# Patient Record
Sex: Female | Born: 1970 | Race: White | Hispanic: No | Marital: Married | Smoking: Never smoker
Health system: Southern US, Community
[De-identification: ages and names within clinical notes are randomized; demographics above are authoritative.]

## PROBLEM LIST (undated history)

## (undated) DIAGNOSIS — Z8719 Personal history of other diseases of the digestive system: Secondary | ICD-10-CM

## (undated) DIAGNOSIS — J189 Pneumonia, unspecified organism: Secondary | ICD-10-CM

## (undated) DIAGNOSIS — D649 Anemia, unspecified: Secondary | ICD-10-CM

## (undated) DIAGNOSIS — IMO0002 Reserved for concepts with insufficient information to code with codable children: Secondary | ICD-10-CM

## (undated) DIAGNOSIS — Z6741 Type O blood, Rh negative: Secondary | ICD-10-CM

## (undated) DIAGNOSIS — O09529 Supervision of elderly multigravida, unspecified trimester: Secondary | ICD-10-CM

## (undated) DIAGNOSIS — B379 Candidiasis, unspecified: Secondary | ICD-10-CM

## (undated) DIAGNOSIS — Z8619 Personal history of other infectious and parasitic diseases: Secondary | ICD-10-CM

## (undated) DIAGNOSIS — Z87440 Personal history of urinary (tract) infections: Secondary | ICD-10-CM

## (undated) DIAGNOSIS — K219 Gastro-esophageal reflux disease without esophagitis: Secondary | ICD-10-CM

## (undated) HISTORY — DX: Supervision of elderly multigravida, unspecified trimester: O09.529

## (undated) HISTORY — DX: Personal history of other infectious and parasitic diseases: Z86.19

## (undated) HISTORY — DX: Candidiasis, unspecified: B37.9

## (undated) HISTORY — PX: EYE SURGERY: SHX253

## (undated) HISTORY — PX: COLPOSCOPY: SHX161

## (undated) HISTORY — DX: Personal history of urinary (tract) infections: Z87.440

## (undated) HISTORY — DX: Anemia, unspecified: D64.9

## (undated) HISTORY — DX: Personal history of other diseases of the digestive system: Z87.19

---

## 1999-10-18 HISTORY — PX: LASIK: SHX215

## 2008-10-17 DIAGNOSIS — R87619 Unspecified abnormal cytological findings in specimens from cervix uteri: Secondary | ICD-10-CM

## 2008-10-17 DIAGNOSIS — IMO0002 Reserved for concepts with insufficient information to code with codable children: Secondary | ICD-10-CM

## 2008-10-17 HISTORY — DX: Unspecified abnormal cytological findings in specimens from cervix uteri: R87.619

## 2008-10-17 HISTORY — DX: Reserved for concepts with insufficient information to code with codable children: IMO0002

## 2011-01-28 ENCOUNTER — Other Ambulatory Visit (HOSPITAL_COMMUNITY): Payer: Self-pay | Admitting: Obstetrics and Gynecology

## 2011-01-28 DIAGNOSIS — IMO0002 Reserved for concepts with insufficient information to code with codable children: Secondary | ICD-10-CM

## 2011-04-07 ENCOUNTER — Ambulatory Visit (HOSPITAL_COMMUNITY): Payer: Managed Care, Other (non HMO)

## 2011-04-07 ENCOUNTER — Inpatient Hospital Stay (HOSPITAL_COMMUNITY)
Admission: AD | Admit: 2011-04-07 | Discharge: 2011-04-07 | Disposition: A | Discharge status: Home / Self Care | Payer: Managed Care, Other (non HMO) | Source: Ambulatory Visit | Attending: Obstetrics and Gynecology | Admitting: Obstetrics and Gynecology

## 2011-04-07 DIAGNOSIS — Z36 Encounter for antenatal screening of mother: Secondary | ICD-10-CM | POA: Insufficient documentation

## 2011-04-08 LAB — RH IMMUNE GLOBULIN WORKUP (NOT WOMEN'S HOSP)

## 2011-08-12 ENCOUNTER — Inpatient Hospital Stay (HOSPITAL_COMMUNITY)
Admission: AD | Admit: 2011-08-12 | Discharge: 2011-08-12 | Disposition: A | Discharge status: Home / Self Care | Payer: Managed Care, Other (non HMO) | Source: Ambulatory Visit | Attending: Obstetrics and Gynecology | Admitting: Obstetrics and Gynecology

## 2011-08-12 DIAGNOSIS — Z2989 Encounter for other specified prophylactic measures: Secondary | ICD-10-CM | POA: Insufficient documentation

## 2011-08-12 DIAGNOSIS — Z3480 Encounter for supervision of other normal pregnancy, unspecified trimester: Secondary | ICD-10-CM | POA: Insufficient documentation

## 2011-08-12 DIAGNOSIS — Z298 Encounter for other specified prophylactic measures: Secondary | ICD-10-CM | POA: Insufficient documentation

## 2011-08-12 MED ORDER — RHO D IMMUNE GLOBULIN 1500 UNIT/2ML IJ SOLN
300.0000 ug | Freq: Once | INTRAMUSCULAR | Status: AC
  Administered 2011-08-12: 300 ug via INTRAMUSCULAR
  Filled 2011-08-12: qty 2

## 2011-08-14 LAB — RH IG WORKUP (INCLUDES ABO/RH)

## 2011-09-17 ENCOUNTER — Encounter (HOSPITAL_COMMUNITY): Payer: Self-pay

## 2011-09-17 ENCOUNTER — Inpatient Hospital Stay (HOSPITAL_COMMUNITY)
Admission: AD | Admit: 2011-09-17 | Discharge: 2011-09-17 | Disposition: A | Discharge status: Home / Self Care | Payer: Managed Care, Other (non HMO) | Source: Ambulatory Visit | Attending: Obstetrics and Gynecology | Admitting: Obstetrics and Gynecology

## 2011-09-17 DIAGNOSIS — O09519 Supervision of elderly primigravida, unspecified trimester: Secondary | ICD-10-CM

## 2011-09-17 DIAGNOSIS — Z3490 Encounter for supervision of normal pregnancy, unspecified, unspecified trimester: Secondary | ICD-10-CM

## 2011-09-17 DIAGNOSIS — O99891 Other specified diseases and conditions complicating pregnancy: Secondary | ICD-10-CM | POA: Insufficient documentation

## 2011-09-17 HISTORY — DX: Reserved for concepts with insufficient information to code with codable children: IMO0002

## 2011-09-17 HISTORY — DX: Pneumonia, unspecified organism: J18.9

## 2011-09-17 LAB — AMNISURE RUPTURE OF MEMBRANE (ROM) NOT AT ARMC: Amnisure ROM: NEGATIVE

## 2011-09-17 NOTE — Progress Notes (Signed)
Patient states she had a gush of clear fluid at 1030 this morning, but has not had any since that time. Has some mild irregular contractions. Reports good fetal movement.

## 2011-09-17 NOTE — Discharge Instructions (Signed)
Fetal Movement Counts °Patient Name: __________________________________________________ Patient Due Date: ____________________ °Kick counts is highly recommended in high risk pregnancies, but it is a good idea for every pregnant woman to do. Start counting fetal movements at 28 weeks of the pregnancy. Fetal movements increase after eating a full meal or eating or drinking something sweet (the blood sugar is higher). It is also important to drink plenty of fluids (well hydrated) before doing the count. Lie on your left side because it helps with the circulation or you can sit in a comfortable chair with your arms over your belly (abdomen) with no distractions around you. °DOING THE COUNT °· Try to do the count the same time of day each time you do it.  °· Mark the day and time, then see how long it takes for you to feel 10 movements (kicks, flutters, swishes, rolls). You should have at least 10 movements within 2 hours. You will most likely feel 10 movements in much less than 2 hours. If you do not, wait an hour and count again. After a couple of days you will see a pattern.  °· What you are looking for is a change in the pattern or not enough counts in 2 hours. Is it taking longer in time to reach 10 movements?  °SEEK MEDICAL CARE IF: °· You feel less than 10 counts in 2 hours. Tried twice.  °· No movement in one hour.  °· The pattern is changing or taking longer each day to reach 10 counts in 2 hours.  °· You feel the baby is not moving as it usually does.  °Date: ____________ Movements: ____________ Start time: ____________ Finish time: ____________  °Date: ____________ Movements: ____________ Start time: ____________ Finish time: ____________ °Date: ____________ Movements: ____________ Start time: ____________ Finish time: ____________ °Date: ____________ Movements: ____________ Start time: ____________ Finish time: ____________ °Date: ____________ Movements: ____________ Start time: ____________ Finish time:  ____________ °Date: ____________ Movements: ____________ Start time: ____________ Finish time: ____________ °Date: ____________ Movements: ____________ Start time: ____________ Finish time: ____________ °Date: ____________ Movements: ____________ Start time: ____________ Finish time: ____________  °Date: ____________ Movements: ____________ Start time: ____________ Finish time: ____________ °Date: ____________ Movements: ____________ Start time: ____________ Finish time: ____________ °Date: ____________ Movements: ____________ Start time: ____________ Finish time: ____________ °Date: ____________ Movements: ____________ Start time: ____________ Finish time: ____________ °Date: ____________ Movements: ____________ Start time: ____________ Finish time: ____________ °Date: ____________ Movements: ____________ Start time: ____________ Finish time: ____________ °Date: ____________ Movements: ____________ Start time: ____________ Finish time: ____________  °Date: ____________ Movements: ____________ Start time: ____________ Finish time: ____________ °Date: ____________ Movements: ____________ Start time: ____________ Finish time: ____________ °Date: ____________ Movements: ____________ Start time: ____________ Finish time: ____________ °Date: ____________ Movements: ____________ Start time: ____________ Finish time: ____________ °Date: ____________ Movements: ____________ Start time: ____________ Finish time: ____________ °Date: ____________ Movements: ____________ Start time: ____________ Finish time: ____________ °Date: ____________ Movements: ____________ Start time: ____________ Finish time: ____________  °Date: ____________ Movements: ____________ Start time: ____________ Finish time: ____________ °Date: ____________ Movements: ____________ Start time: ____________ Finish time: ____________ °Date: ____________ Movements: ____________ Start time: ____________ Finish time: ____________ °Date: ____________ Movements:  ____________ Start time: ____________ Finish time: ____________ °Date: ____________ Movements: ____________ Start time: ____________ Finish time: ____________ °Date: ____________ Movements: ____________ Start time: ____________ Finish time: ____________ °Date: ____________ Movements: ____________ Start time: ____________ Finish time: ____________  °Date: ____________ Movements: ____________ Start time: ____________ Finish time: ____________ °Date: ____________ Movements: ____________ Start time: ____________ Finish time: ____________ °Date: ____________ Movements: ____________ Start time:   ____________ Finish time: ____________ °Date: ____________ Movements: ____________ Start time: ____________ Finish time: ____________ °Date: ____________ Movements: ____________ Start time: ____________ Finish time: ____________ °Date: ____________ Movements: ____________ Start time: ____________ Finish time: ____________ °Date: ____________ Movements: ____________ Start time: ____________ Finish time: ____________  °Date: ____________ Movements: ____________ Start time: ____________ Finish time: ____________ °Date: ____________ Movements: ____________ Start time: ____________ Finish time: ____________ °Date: ____________ Movements: ____________ Start time: ____________ Finish time: ____________ °Date: ____________ Movements: ____________ Start time: ____________ Finish time: ____________ °Date: ____________ Movements: ____________ Start time: ____________ Finish time: ____________ °Date: ____________ Movements: ____________ Start time: ____________ Finish time: ____________ °Date: ____________ Movements: ____________ Start time: ____________ Finish time: ____________  °Date: ____________ Movements: ____________ Start time: ____________ Finish time: ____________ °Date: ____________ Movements: ____________ Start time: ____________ Finish time: ____________ °Date: ____________ Movements: ____________ Start time: ____________ Finish  time: ____________ °Date: ____________ Movements: ____________ Start time: ____________ Finish time: ____________ °Date: ____________ Movements: ____________ Start time: ____________ Finish time: ____________ °Date: ____________ Movements: ____________ Start time: ____________ Finish time: ____________ °Date: ____________ Movements: ____________ Start time: ____________ Finish time: ____________  °Date: ____________ Movements: ____________ Start time: ____________ Finish time: ____________ °Date: ____________ Movements: ____________ Start time: ____________ Finish time: ____________ °Date: ____________ Movements: ____________ Start time: ____________ Finish time: ____________ °Date: ____________ Movements: ____________ Start time: ____________ Finish time: ____________ °Date: ____________ Movements: ____________ Start time: ____________ Finish time: ____________ °Date: ____________ Movements: ____________ Start time: ____________ Finish time: ____________ °Document Released: 11/02/2006 Document Revised: 06/15/2011 Document Reviewed: 05/05/2009 °[employer]® Patient Information ©2012 [employer]. °

## 2011-09-17 NOTE — ED Provider Notes (Signed)
History     Chief Complaint  Patient presents with  . Rupture of Membranes   HPI Comments: Pt is a G1P0 at [redacted]w[redacted]d that arrived with report of LOF at about 1030 after standing up from the bath, has had no more fluid since. Reports some spotting on Thurs after exam at office. Reports some ctx, but nothing regular or painful, Denies VB, +FM. Pregnancy significant for: RH neg      Past Medical History  Diagnosis Date  . Pneumonia   . Abnormal Pap smear     Past Surgical History  Procedure Date  . Colposcopy     x2  . Lasik     History reviewed. No pertinent family history.  History  Substance Use Topics  . Smoking status: Never Smoker   . Smokeless tobacco: Never Used  . Alcohol Use:     Allergies:  Allergies  Allergen Reactions  . Almond Meal     Throat itching    Prescriptions prior to admission  Medication Sig Dispense Refill  . magnesium hydroxide (MILK OF MAGNESIA) 400 MG/5ML suspension Take 30 mLs by mouth daily as needed. constipation       . prenatal vitamin w/FE, FA (PRENATAL 1 + 1) 27-1 MG TABS Take 1 tablet by mouth daily.          Review of Systems  Genitourinary:       Discharge  All other systems reviewed and are negative.   Physical Exam   Blood pressure 132/77, pulse 75, temperature 98.5 F (36.9 C), temperature source Oral, resp. rate 20, height 5\' 6"  (1.676 m), weight 77.021 kg (169 lb 12.8 oz), SpO2 98.00%.  Physical Exam  Nursing note and vitals reviewed. Constitutional: She is oriented to person, place, and time. She appears well-developed.  Cardiovascular: Normal rate.   Respiratory: Effort normal.  GI: Soft.  Genitourinary: Vagina normal.       lg amt thick light brown mucous in vault, neg pooling Neg fern Cx=1/th/high/ballottable   Musculoskeletal: Normal range of motion.  Neurological: She is alert and oriented to person, place, and time.  Skin: Skin is warm and dry.  Psychiatric: She has a normal mood and affect. Her  behavior is normal.   FHR 130 - reactive, CAT ! toco 5-8 with irritability  MAU Course  Procedures    Assessment and Plan  IUP at 39wks R/O SROM Fern neg amnisure pending  FHR reassuring   Kamareon Sciandra M 09/17/2011, 4:26 PM   amnisure neg Reviewed labor sx's and FKC  DC'd home  F/U wed as scheduled at office

## 2011-09-23 ENCOUNTER — Telehealth (HOSPITAL_COMMUNITY): Payer: Self-pay | Admitting: *Deleted

## 2011-09-23 ENCOUNTER — Encounter (HOSPITAL_COMMUNITY): Payer: Self-pay | Admitting: *Deleted

## 2011-09-23 LAB — STREP B DNA PROBE: GBS: NEGATIVE

## 2011-09-23 NOTE — Telephone Encounter (Signed)
Preadmission screen  

## 2011-09-25 ENCOUNTER — Encounter (HOSPITAL_COMMUNITY): Payer: Self-pay | Admitting: Pharmacist

## 2011-09-26 ENCOUNTER — Inpatient Hospital Stay (HOSPITAL_COMMUNITY)
Admission: RE | Admit: 2011-09-26 | Discharge: 2011-09-29 | DRG: 774 | Disposition: A | Discharge status: Home / Self Care | Payer: Managed Care, Other (non HMO) | Source: Ambulatory Visit | Attending: Obstetrics and Gynecology | Admitting: Obstetrics and Gynecology

## 2011-09-26 ENCOUNTER — Encounter (HOSPITAL_COMMUNITY): Payer: Self-pay

## 2011-09-26 DIAGNOSIS — K644 Residual hemorrhoidal skin tags: Secondary | ICD-10-CM

## 2011-09-26 DIAGNOSIS — O09519 Supervision of elderly primigravida, unspecified trimester: Secondary | ICD-10-CM | POA: Diagnosis present

## 2011-09-26 DIAGNOSIS — D649 Anemia, unspecified: Secondary | ICD-10-CM | POA: Diagnosis not present

## 2011-09-26 DIAGNOSIS — K649 Unspecified hemorrhoids: Secondary | ICD-10-CM | POA: Diagnosis present

## 2011-09-26 DIAGNOSIS — O480 Post-term pregnancy: Principal | ICD-10-CM | POA: Diagnosis present

## 2011-09-26 DIAGNOSIS — O9903 Anemia complicating the puerperium: Secondary | ICD-10-CM | POA: Diagnosis not present

## 2011-09-26 DIAGNOSIS — O878 Other venous complications in the puerperium: Secondary | ICD-10-CM | POA: Diagnosis present

## 2011-09-26 LAB — CBC
HCT: 34.5 % — ABNORMAL LOW (ref 36.0–46.0)
MCHC: 33.9 g/dL (ref 30.0–36.0)
RDW: 12.5 % (ref 11.5–15.5)

## 2011-09-26 MED ORDER — OXYTOCIN 20 UNITS IN LACTATED RINGERS INFUSION - SIMPLE
1.0000 m[IU]/min | INTRAVENOUS | Status: DC
  Administered 2011-09-27: 2 m[IU]/min via INTRAVENOUS
  Filled 2011-09-26: qty 1000

## 2011-09-26 MED ORDER — SODIUM CHLORIDE 0.9 % IJ SOLN: 3.0000 mL | Freq: Two times a day (BID) | INTRAMUSCULAR | Status: DC

## 2011-09-26 MED ORDER — ONDANSETRON HCL 4 MG/2ML IJ SOLN: 4.0000 mg | Freq: Four times a day (QID) | INTRAMUSCULAR | Status: DC | PRN

## 2011-09-26 MED ORDER — SODIUM CHLORIDE 0.9 % IV SOLN: 250.0000 mL | INTRAVENOUS | Status: DC | PRN

## 2011-09-26 MED ORDER — IBUPROFEN 600 MG PO TABS: 600.0000 mg | ORAL_TABLET | Freq: Four times a day (QID) | ORAL | Status: DC | PRN

## 2011-09-26 MED ORDER — ZOLPIDEM TARTRATE 10 MG PO TABS
10.0000 mg | ORAL_TABLET | Freq: Every evening | ORAL | Status: DC | PRN
  Administered 2011-09-26: 10 mg via ORAL
  Filled 2011-09-26: qty 1

## 2011-09-26 MED ORDER — OXYCODONE-ACETAMINOPHEN 5-325 MG PO TABS: 2.0000 | ORAL_TABLET | ORAL | Status: DC | PRN

## 2011-09-26 MED ORDER — OXYTOCIN 20 UNITS IN LACTATED RINGERS INFUSION - SIMPLE
125.0000 mL/h | Freq: Once | INTRAVENOUS | Status: AC
  Administered 2011-09-27: 999 mL/h via INTRAVENOUS

## 2011-09-26 MED ORDER — LIDOCAINE HCL (PF) 1 % IJ SOLN
30.0000 mL | INTRAMUSCULAR | Status: DC | PRN
  Administered 2011-09-27: 30 mL via SUBCUTANEOUS
  Filled 2011-09-26: qty 30

## 2011-09-26 MED ORDER — HYDROXYZINE HCL 50 MG PO TABS: 50.0000 mg | ORAL_TABLET | Freq: Four times a day (QID) | ORAL | Status: DC | PRN

## 2011-09-26 MED ORDER — BUTORPHANOL TARTRATE 2 MG/ML IJ SOLN: 1.0000 mg | INTRAMUSCULAR | Status: DC | PRN

## 2011-09-26 MED ORDER — ACETAMINOPHEN 325 MG PO TABS: 650.0000 mg | ORAL_TABLET | ORAL | Status: DC | PRN

## 2011-09-26 MED ORDER — TERBUTALINE SULFATE 1 MG/ML IJ SOLN: 0.2500 mg | Freq: Once | INTRAMUSCULAR | Status: AC | PRN

## 2011-09-26 MED ORDER — MISOPROSTOL 25 MCG QUARTER TABLET
25.0000 ug | ORAL_TABLET | ORAL | Status: DC | PRN
  Administered 2011-09-26 – 2011-09-27 (×2): 25 ug via VAGINAL
  Filled 2011-09-26 (×2): qty 0.25

## 2011-09-26 MED ORDER — LACTATED RINGERS IV SOLN
500.0000 mL | INTRAVENOUS | Status: DC | PRN
  Administered 2011-09-27: 1000 mL via INTRAVENOUS
  Administered 2011-09-27: 500 mL via INTRAVENOUS

## 2011-09-26 MED ORDER — CITRIC ACID-SODIUM CITRATE 334-500 MG/5ML PO SOLN: 30.0000 mL | ORAL | Status: DC | PRN

## 2011-09-26 MED ORDER — SODIUM CHLORIDE 0.9 % IJ SOLN: 3.0000 mL | INTRAMUSCULAR | Status: DC | PRN

## 2011-09-26 MED ORDER — FLEET ENEMA 7-19 GM/118ML RE ENEM: 1.0000 | ENEMA | RECTAL | Status: DC | PRN

## 2011-09-26 MED ORDER — OXYTOCIN BOLUS FROM INFUSION
500.0000 mL | Freq: Once | INTRAVENOUS | Status: DC
  Filled 2011-09-26: qty 500

## 2011-09-26 MED ORDER — HYDROXYZINE HCL 50 MG/ML IM SOLN: 50.0000 mg | Freq: Four times a day (QID) | INTRAMUSCULAR | Status: DC | PRN

## 2011-09-27 ENCOUNTER — Encounter (HOSPITAL_COMMUNITY): Payer: Self-pay

## 2011-09-27 ENCOUNTER — Inpatient Hospital Stay (HOSPITAL_COMMUNITY): Payer: Managed Care, Other (non HMO) | Admitting: Anesthesiology

## 2011-09-27 ENCOUNTER — Encounter (HOSPITAL_COMMUNITY): Payer: Self-pay | Admitting: Anesthesiology

## 2011-09-27 DIAGNOSIS — O480 Post-term pregnancy: Secondary | ICD-10-CM | POA: Diagnosis present

## 2011-09-27 LAB — CBC
HCT: 34 % — ABNORMAL LOW (ref 36.0–46.0)
Hemoglobin: 11.3 g/dL — ABNORMAL LOW (ref 12.0–15.0)
MCH: 32.4 pg (ref 26.0–34.0)
MCH: 32.7 pg (ref 26.0–34.0)
MCHC: 33.2 g/dL (ref 30.0–36.0)
MCHC: 33.4 g/dL (ref 30.0–36.0)
MCV: 97.7 fL (ref 78.0–100.0)
Platelets: 98 10*3/uL — ABNORMAL LOW (ref 150–400)
RDW: 12.6 % (ref 11.5–15.5)
RDW: 12.5 % (ref 11.5–15.5)

## 2011-09-27 MED ORDER — LANOLIN HYDROUS EX OINT: TOPICAL_OINTMENT | CUTANEOUS | Status: DC | PRN

## 2011-09-27 MED ORDER — MAGNESIUM HYDROXIDE 400 MG/5ML PO SUSP: 30.0000 mL | ORAL | Status: DC | PRN

## 2011-09-27 MED ORDER — BENZOCAINE-MENTHOL 20-0.5 % EX AERO
INHALATION_SPRAY | CUTANEOUS | Status: AC
  Administered 2011-09-27: 1 via TOPICAL
  Filled 2011-09-27: qty 56

## 2011-09-27 MED ORDER — PHENYLEPHRINE 40 MCG/ML (10ML) SYRINGE FOR IV PUSH (FOR BLOOD PRESSURE SUPPORT)
80.0000 ug | PREFILLED_SYRINGE | INTRAVENOUS | Status: DC | PRN
  Filled 2011-09-27: qty 5

## 2011-09-27 MED ORDER — LACTATED RINGERS IV SOLN: 500.0000 mL | Freq: Once | INTRAVENOUS | Status: DC

## 2011-09-27 MED ORDER — PRENATAL PLUS 27-1 MG PO TABS
1.0000 | ORAL_TABLET | Freq: Every day | ORAL | Status: DC
  Administered 2011-09-28 – 2011-09-29 (×2): 1 via ORAL
  Filled 2011-09-27 (×2): qty 1

## 2011-09-27 MED ORDER — EPHEDRINE 5 MG/ML INJ
10.0000 mg | INTRAVENOUS | Status: AC | PRN
  Administered 2011-09-27 (×2): 10 mg via INTRAVENOUS

## 2011-09-27 MED ORDER — DIPHENHYDRAMINE HCL 25 MG PO CAPS: 25.0000 mg | ORAL_CAPSULE | Freq: Four times a day (QID) | ORAL | Status: DC | PRN

## 2011-09-27 MED ORDER — EPHEDRINE 5 MG/ML INJ
10.0000 mg | INTRAVENOUS | Status: DC | PRN
  Filled 2011-09-27: qty 4

## 2011-09-27 MED ORDER — MISOPROSTOL 200 MCG PO TABS: 800.0000 ug | ORAL_TABLET | Freq: Once | ORAL | Status: DC

## 2011-09-27 MED ORDER — OXYTOCIN 20 UNITS IN LACTATED RINGERS INFUSION - SIMPLE: 1.0000 m[IU]/min | INTRAVENOUS | Status: DC

## 2011-09-27 MED ORDER — SENNOSIDES-DOCUSATE SODIUM 8.6-50 MG PO TABS
2.0000 | ORAL_TABLET | Freq: Every day | ORAL | Status: DC
  Administered 2011-09-27 – 2011-09-29 (×2): 2 via ORAL

## 2011-09-27 MED ORDER — DIPHENHYDRAMINE HCL 50 MG/ML IJ SOLN: 12.5000 mg | INTRAMUSCULAR | Status: DC | PRN

## 2011-09-27 MED ORDER — SIMETHICONE 80 MG PO CHEW: 80.0000 mg | CHEWABLE_TABLET | ORAL | Status: DC | PRN

## 2011-09-27 MED ORDER — FENTANYL 2.5 MCG/ML BUPIVACAINE 1/10 % EPIDURAL INFUSION (WH - ANES)
INTRAMUSCULAR | Status: DC | PRN
  Administered 2011-09-27: 14 mL/h via EPIDURAL

## 2011-09-27 MED ORDER — PHENYLEPHRINE 40 MCG/ML (10ML) SYRINGE FOR IV PUSH (FOR BLOOD PRESSURE SUPPORT): 80.0000 ug | PREFILLED_SYRINGE | INTRAVENOUS | Status: DC | PRN

## 2011-09-27 MED ORDER — MISOPROSTOL 200 MCG PO TABS
ORAL_TABLET | ORAL | Status: AC
  Administered 2011-09-27: 800 ug via RECTAL
  Filled 2011-09-27: qty 4

## 2011-09-27 MED ORDER — DIBUCAINE 1 % RE OINT
1.0000 "application " | TOPICAL_OINTMENT | RECTAL | Status: DC | PRN
  Administered 2011-09-28: 1 via RECTAL
  Filled 2011-09-27: qty 28

## 2011-09-27 MED ORDER — ZOLPIDEM TARTRATE 5 MG PO TABS: 5.0000 mg | ORAL_TABLET | Freq: Every evening | ORAL | Status: DC | PRN

## 2011-09-27 MED ORDER — IBUPROFEN 600 MG PO TABS
600.0000 mg | ORAL_TABLET | Freq: Four times a day (QID) | ORAL | Status: DC
  Administered 2011-09-27 – 2011-09-29 (×7): 600 mg via ORAL
  Filled 2011-09-27 (×7): qty 1

## 2011-09-27 MED ORDER — SODIUM BICARBONATE 8.4 % IV SOLN
INTRAVENOUS | Status: DC | PRN
  Administered 2011-09-27: 5 mL via EPIDURAL

## 2011-09-27 MED ORDER — OXYCODONE-ACETAMINOPHEN 5-325 MG PO TABS
1.0000 | ORAL_TABLET | ORAL | Status: DC | PRN
  Administered 2011-09-27 – 2011-09-28 (×3): 1 via ORAL
  Administered 2011-09-28: 2 via ORAL
  Administered 2011-09-28 – 2011-09-29 (×4): 1 via ORAL
  Filled 2011-09-27 (×5): qty 1
  Filled 2011-09-27: qty 2
  Filled 2011-09-27 (×2): qty 1

## 2011-09-27 MED ORDER — WITCH HAZEL-GLYCERIN EX PADS
1.0000 "application " | MEDICATED_PAD | CUTANEOUS | Status: DC | PRN
  Administered 2011-09-28: 1 via TOPICAL

## 2011-09-27 MED ORDER — LACTATED RINGERS IV SOLN: INTRAVENOUS | Status: DC

## 2011-09-27 MED ORDER — ONDANSETRON HCL 4 MG PO TABS: 4.0000 mg | ORAL_TABLET | ORAL | Status: DC | PRN

## 2011-09-27 MED ORDER — ONDANSETRON HCL 4 MG/2ML IJ SOLN: 4.0000 mg | INTRAMUSCULAR | Status: DC | PRN

## 2011-09-27 MED ORDER — BENZOCAINE-MENTHOL 20-0.5 % EX AERO
1.0000 "application " | INHALATION_SPRAY | CUTANEOUS | Status: DC | PRN
  Administered 2011-09-27: 1 via TOPICAL

## 2011-09-27 MED ORDER — TETANUS-DIPHTH-ACELL PERTUSSIS 5-2.5-18.5 LF-MCG/0.5 IM SUSP
0.5000 mL | Freq: Once | INTRAMUSCULAR | Status: AC
Start: 2011-09-28 — End: 2011-09-28
  Administered 2011-09-28: 0.5 mL via INTRAMUSCULAR
  Filled 2011-09-27: qty 0.5

## 2011-09-27 MED ORDER — FENTANYL 2.5 MCG/ML BUPIVACAINE 1/10 % EPIDURAL INFUSION (WH - ANES)
14.0000 mL/h | INTRAMUSCULAR | Status: DC
  Administered 2011-09-27: 14 mL/h via EPIDURAL
  Filled 2011-09-27 (×2): qty 60

## 2011-09-27 NOTE — Progress Notes (Signed)
Updated Dr Pennie Rushing on progress through night patient's current UC, FHR and last SVE and patient desire for epidural. Dr Pennie Rushing ordered to not start pitocin at this time.

## 2011-09-27 NOTE — Anesthesia Preprocedure Evaluation (Signed)
Anesthesia Evaluation  Patient identified by MRN, date of birth, ID band Patient awake    Reviewed: Allergy & Precautions, H&P , Patient's Chart, lab work & pertinent test results  Airway Mallampati: II TM Distance: >3 FB Neck ROM: full    Dental  (+) Teeth Intact   Pulmonary  clear to auscultation        Cardiovascular regular Normal    Neuro/Psych    GI/Hepatic   Endo/Other    Renal/GU      Musculoskeletal   Abdominal   Peds  Hematology   Anesthesia Other Findings       Reproductive/Obstetrics (+) Pregnancy                           Anesthesia Physical Anesthesia Plan  ASA: II  Anesthesia Plan: Epidural   Post-op Pain Management:    Induction:   Airway Management Planned:   Additional Equipment:   Intra-op Plan:   Post-operative Plan:   Informed Consent:   Plan Discussed with:   Anesthesia Plan Comments:         Anesthesia Quick Evaluation  

## 2011-09-27 NOTE — Op Note (Signed)
Delivery Note At 1:56 PM a viable female was delivered via Vaginal, Spontaneous Delivery (Presentation: Right Occiput Anterior).  APGAR: 9, 9; weight 9 lb 9.6 oz (4355 g).   Placenta status: Intact, Spontaneous.  Cord: 3 vessels with the following complications: None.  Cord pH: na Complication:  Immediate PPH resolved quickly with cytotec 800 mcg placed rectally during laceration repair. Placenta given to [facility] rep for cord blood donation.  Anesthesia: Epidural  Episiotomy: None Lacerations: 2nd degree Suture Repair: 3.0 vicryl Est. Blood Loss (mL): 750-1000cc  Mom to postpartum.  Baby to nursery-stable.  [name] P 09/27/2011, 4:02 PM

## 2011-09-27 NOTE — Anesthesia Procedure Notes (Signed)

## 2011-09-27 NOTE — H&P (Signed)
Lynn Ibarra is a 40 y.o.married white female presenting at 40.5 weeks per [facility] 09/21/11 for term induction of labor. Presents without complaints.  Followed by Dr. Pennie Rushing at [facility].  Onset of care at 9 weeks.  Pregnancy overall has been uncomplicated.  Pt does report most recent u/s (which is not in records on admission tonight) showed possible LGA.  Pt did receive flu shot during pregnancy.  She denies VB, LOF, abnl d/c, UTI or PIH s/s tonight.  Accompanied to hospital by her husband, but he plans to go home for evening.   Maternal Medical History:  Reason for admission: Induction of labor  Contractions: Frequency: rare.   Perceived severity is mild.    Fetal activity: Perceived fetal activity is normal.   Last perceived fetal movement was within the past hour.    Prenatal complications: 1.  AMA--nml genetic amniocentesis 75 XY 2.  H/o abnl pap 3.  Rh negative 4.  Husband ED physician    OB History    Grav Para Term Preterm Abortions TAB SAB Ect Mult Living   1              Past Medical History  Diagnosis Date  . Pneumonia   . Abnormal Pap smear   . AMA (advanced maternal age) multigravida 35+    Past Surgical History  Procedure Date  . Colposcopy     x2  . Lasik   . Eye surgery     lasic   Family History: family history includes Cancer in her father, paternal aunt, and paternal grandfather; Depression in her paternal aunt; Diabetes in her maternal grandmother; Heart disease in her paternal aunt; Hypertension in her father and maternal grandmother; Kidney disease in her father; Peripheral vascular disease in her father and mother; Seizures in her maternal grandmother; and Stroke in her maternal grandfather. Social History:  reports that she has never smoked. She has never used smokeless tobacco. She reports that she does not drink alcohol or use illicit drugs.  Review of Systems  Constitutional: Negative.   HENT: Negative.   Eyes: Negative.   Respiratory: Negative.     Cardiovascular: Negative.   Gastrointestinal: Negative.   Genitourinary: Negative.   Skin: Negative.   Neurological: Negative.     Blood pressure 95/53, pulse 63, temperature 97.6 F (36.4 C), temperature source Oral, resp. rate 20, height 5\' 5"  (1.651 m), weight 77.111 kg (170 lb), last menstrual period 12/15/2010, SpO2 98.00%. Maternal Exam:  Uterine Assessment: Contraction strength is mild.  Contraction frequency is irregular.  occ'l ctx on arrival, with some UI  Abdomen: Patient reports no abdominal tenderness. Estimated fetal weight is 8-9 lbs.   Fetal presentation: vertex  Introitus: Normal vulva. Pelvis: adequate for delivery.   Cervix: Cervix evaluated by digital exam.     Fetal Exam Fetal Monitor Review: Mode: ultrasound.   Baseline rate: 150.  Variability: moderate (6-25 bpm).   Pattern: accelerations present and no decelerations.    Fetal State Assessment: Category I - tracings are normal.     Results for orders placed during the hospital encounter of 09/26/11 (from the past 24 hour(s))  CBC     Status: Abnormal   Collection Time   09/26/11  8:50 PM      Component Value Range   WBC 7.9  4.0 - 10.5 (K/uL)   RBC 3.59 (*) 3.87 - 5.11 (MIL/uL)   Hemoglobin 11.7 (*) 12.0 - 15.0 (g/dL)   HCT 54.0 (*) 98.1 - 46.0 (%)  MCV 96.1  78.0 - 100.0 (fL)   MCH 32.6  26.0 - 34.0 (pg)   MCHC 33.9  30.0 - 36.0 (g/dL)   RDW 04.5  40.9 - 81.1 (%)   Platelets 110 (*) 150 - 400 (K/uL)  RPR     Status: Normal   Collection Time   09/26/11  8:50 PM      Component Value Range   RPR NON REACTIVE  NON REACTIVE   CBC     Status: Abnormal   Collection Time   09/27/11  7:33 AM      Component Value Range   WBC 9.4  4.0 - 10.5 (K/uL)   RBC 3.49 (*) 3.87 - 5.11 (MIL/uL)   Hemoglobin 11.3 (*) 12.0 - 15.0 (g/dL)   HCT 91.4 (*) 78.2 - 46.0 (%)   MCV 97.4  78.0 - 100.0 (fL)   MCH 32.4  26.0 - 34.0 (pg)   MCHC 33.2  30.0 - 36.0 (g/dL)   RDW 95.6  21.3 - 08.6 (%)   Platelets 100  (*) 150 - 400 (K/uL)   Physical Exam  Constitutional: She is oriented to person, place, and time. She appears well-developed and well-nourished. No distress.  Cardiovascular: Normal rate and regular rhythm.   Respiratory: Effort normal and breath sounds normal.  GI: Soft. Bowel sounds are normal.       gravid  Genitourinary:       cx on admission 1.5/30-40/-2, anterior, Vtx, soft  Musculoskeletal: She exhibits edema.       Mild BLE non-pitting edema  Neurological: She is alert and oriented to person, place, and time. She has normal reflexes.       No clonus  Skin: Skin is warm and dry. She is not diaphoretic.  Psychiatric: She has a normal mood and affect. Her behavior is normal. Judgment and thought content normal.    Prenatal labs: ABO, Rh: --/--/O NEG (10/26 1518) Antibody: NEG (06/21 1803) Rubella: Immune (05/17 0000) RPR: NON REACTIVE (12/10 2050)  HBsAg: Negative (05/17 0000)  HIV: Non-reactive (05/17 0000)  GBS: Negative (12/07 0000)   Assessment/Plan: 1.  IUP at 40.5 weeks 2.  Post-term w/ favorable cx(Bishop's score=7) 3.  Cat I FHT 4.  GBS negative 5.  Rh negative 6.  AMA--46XY genetic amniocentesis 2nd trimester  1.  Admit to BS w/ routine L&D orders with Dr. Estanislado Pandy as attending 2.  Risks of induction, including but not limited to increased risk of cesarean rev'd w/ pt and s.o.; pt did verbalize understanding and desired to proceed with induction; Given options of cervidil or cytotec, and r/b/a of each discussed; after discussion, pt decided to proceed w/ cytotec overnight, and plan Pitocin in AM if needed. 3.  MD to follow  STEELMAN,CANDICE H 09/27/2011, 9:01 AM

## 2011-09-27 NOTE — Progress Notes (Signed)
Lynn Ibarra is a 40 y.o. G1P0 at [redacted]w[redacted]d by LMP admitted for induction of labor due to Post dates. Due date 09/21/11.  Subjective: Now comfortable with epidural   Objective: BP 110/55  Pulse 64  Temp(Src) 97.6 F (36.4 C) (Oral)  Resp 20  Ht 5\' 5"  (1.651 m)  Wt 170 lb (77.111 kg)  BMI 28.29 kg/m2  SpO2 97%  LMP 12/15/2010      FHT:  FHR: 140s bpm, variability: minimal ,  accelerations:  Present,  decelerations:  Absent UC:   regular, every 2-3 minutes SVE:   Dilation: 3 Effacement (%): 60 Station: -2 Exam by:: Roen Macgowan  Labs: Lab Results  Component Value Date   WBC 9.4 09/27/2011   HGB 11.3* 09/27/2011   HCT 34.0* 09/27/2011   MCV 97.4 09/27/2011   PLT 100* 09/27/2011    Assessment / Plan: Induction of labor due to postterm,  progressing well on pitocin  Labor: Progressing normally Preeclampsia:  no signs or symptoms of toxicity Fetal Wellbeing:  Category II Pain Control:  Epidural I/D:  n/a Anticipated MOD:  NSVD  Kashon Kraynak P 09/27/2011, 10:02 AM

## 2011-09-28 DIAGNOSIS — K644 Residual hemorrhoidal skin tags: Secondary | ICD-10-CM

## 2011-09-28 LAB — CBC
Hemoglobin: 8.7 g/dL — ABNORMAL LOW (ref 12.0–15.0)
Platelets: 91 10*3/uL — ABNORMAL LOW (ref 150–400)
RBC: 2.64 MIL/uL — ABNORMAL LOW (ref 3.87–5.11)
WBC: 14 10*3/uL — ABNORMAL HIGH (ref 4.0–10.5)

## 2011-09-28 MED ORDER — RHO D IMMUNE GLOBULIN 1500 UNIT/2ML IJ SOLN
300.0000 ug | Freq: Once | INTRAMUSCULAR | Status: AC
  Administered 2011-09-28: 300 ug via INTRAMUSCULAR
  Filled 2011-09-28: qty 2

## 2011-09-28 MED ORDER — HYDROCORTISONE ACETATE 25 MG RE SUPP
25.0000 mg | Freq: Four times a day (QID) | RECTAL | Status: DC | PRN
  Administered 2011-09-28: 25 mg via RECTAL
  Filled 2011-09-28: qty 1

## 2011-09-28 NOTE — Progress Notes (Signed)
Post Partum Day 1 Subjective: up ad lib and voiding, complaints of sore bottom, breastfeeding well  Objective: Blood pressure 125/81, pulse 62, temperature 97.5 F (36.4 C), temperature source Oral, resp. rate 18, height 5\' 5"  (1.651 m), weight 77.111 kg (170 lb), last menstrual period 12/15/2010, SpO2 97.00%, unknown if currently breastfeeding.  Physical Exam:  General: cooperative, fatigued and no distress Lochia: appropriate Uterine Fundus: firm Incision: healing well, no redness, edema, or drainage with ecchymosis, soft DVT Evaluation: neg Homans bilaterally   Basename 09/28/11 0520 09/27/11 1522  HGB 8.7* 10.1*  HCT 25.8* 30.2*    Assessment/Plan: Pp day 1 Breastfeeding and Lactation consult, anusol HC suppositories discussed, sitz bathes, ambulation   LOS: 2 days   Gibson Telleria M 09/28/2011, 10:37 AM

## 2011-09-28 NOTE — Anesthesia Postprocedure Evaluation (Signed)
  Anesthesia Post-op Note  Patient: Lynn Ibarra  Procedure(s) Performed: * No procedures listed *  Patient Location: PACU and Mother/Baby  Anesthesia Type: Epidural  Level of Consciousness: awake, alert  and oriented  Airway and Oxygen Therapy: Patient Spontanous Breathing  Post-op Pain: none  Post-op Assessment: Post-op Vital signs reviewed  Post-op Vital Signs: Reviewed and stable  Complications: No apparent anesthesia complications

## 2011-09-28 NOTE — Progress Notes (Signed)
Post Partum Day 1 Subjective: C/O perineal discomfort. No dizziness. Ambulating without difficulty.  Objective: Blood pressure 125/81, pulse 62, temperature 97.5 F (36.4 C), temperature source Oral, resp. rate 18, height 5\' 5"  (1.651 m), weight 77.111 kg (170 lb), last menstrual period 12/15/2010, SpO2 97.00%, unknown if currently breastfeeding.  Physical Exam:  General: alert Lochia: appropriate Uterine Fundus: firm Incision: NA DVT Evaluation: No evidence of DVT seen on physical exam. Perineum edematous.   Basename 09/28/11 0520 09/27/11 1522  HGB 8.7* 10.1*  HCT 25.8* 30.2*    Assessment/Plan: Plan for discharge tomorrow Anemia- not orthostatic Patient will consider contraception.   LOS: 2 days   Samba Cumba V 09/28/2011, 8:57 AM

## 2011-09-29 LAB — RH IG WORKUP (INCLUDES ABO/RH)
ABO/RH(D): O NEG
Gestational Age(Wks): 40

## 2011-09-29 MED ORDER — IBUPROFEN 600 MG PO TABS: 600.0000 mg | ORAL_TABLET | Freq: Four times a day (QID) | ORAL | Status: AC | PRN

## 2011-09-29 MED ORDER — HYDROCORTISONE ACETATE 25 MG RE SUPP: 25.0000 mg | Freq: Four times a day (QID) | RECTAL | Status: AC | PRN

## 2011-09-29 MED ORDER — NORETHINDRONE 0.35 MG PO TABS: 1.0000 | ORAL_TABLET | Freq: Every day | ORAL | Status: DC

## 2011-09-29 NOTE — Discharge Summary (Signed)
Obstetric Discharge Summary Reason for Admission: induction of labor cytotec and pitocin for  postdates Prenatal Procedure: ultrasound, nst Intrapartum Procedures: spontaneous vaginal delivery Postpartum Procedures: none Complications-Operative and Postpartum: 2nd  degree perineal laceration and hemorrhage Hemoglobin  Date Value Range Status  09/28/2011 8.7* 12.0-15.0 (g/dL) Final     HCT  Date Value Range Status  09/28/2011 25.8* 36.0-46.0 (%) Final  hospital course: admitted for induction postdates, cytotec and pitocin, SVD, epidural, GBS-, pp hemorrhage, 2nd laceration. Anemia pp stable. Hemorrhoids better today. Physical exam WNL. Breastfeeding. Plans micronor. PO pain meds with benefit.  Discharge Diagnoses: Term Pregnancy-delivered and Post-date pregnancy anemia  Discharge Information: Date: 09/29/2011 Activity: pelvic rest Diet: routine Medications: Ibuprofen, Iron and anusol HC suppository, micronor Condition: stable Instructions: refer to practice specific booklet Discharge to: home Follow-up Information    Follow up with [facility] in 6 weeks.         Newborn Data: Live born female  Birth Weight: 9 lb 9.6 oz (4355 g) APGAR: 9, 9  Home with mother.  Lynn Ibarra 09/29/2011, 9:18 AM

## 2011-10-03 ENCOUNTER — Ambulatory Visit (HOSPITAL_COMMUNITY)
Admission: RE | Admit: 2011-10-03 | Discharge: 2011-10-03 | Disposition: A | Discharge status: Home / Self Care | Payer: Managed Care, Other (non HMO) | Source: Ambulatory Visit | Attending: Obstetrics and Gynecology | Admitting: Obstetrics and Gynecology

## 2011-10-03 NOTE — Progress Notes (Signed)
Adult Lactation Consultation Outpatient Visit Note  Patient Name: Lynn Ibarra Date of Birth: 17-Oct-1971 Gestational Age at Delivery: [redacted]w[redacted]d Type of Delivery:   Breastfeeding History: Frequency of Breastfeeding: q 1 1/2 hours- very frantic at breast Length of Feeding: 10-15 minutes Voids: 6 Stools: 3-4  Supplementing / Method: none Pumping:  Type of Pump:   Frequency:  Volume:    Comments:    Consultation Evaluation: Sore nipples- pinching at breast. Positional stripes on both nipples but left is worse.  Initial Feeding Assessment: Pre-feed Weight: 9 lbs 7.7 oz 4300 g Post-feed Weight: 9 lbs 9.6 oc 4345g Amount Transferred: 45cc's Comments: Nursed on left side with 20 nipple shield for 15 minutes and off to sleep  Additional Feeding Assessment: Pre-feed Weight: Post-feed Weight: Amount Transferred: Comments:  Additional Feeding Assessment: Pre-feed Weight: Post-feed Weight: Amount Transferred: Comments:  Total Breast milk Transferred this Visit:  Total Supplement Given:   Additional Interventions: Observed baby's frenulum- appears tight- baby unable to extend tongue over bottom gum. Father is ER physician. Called Dr Chales Salmon and appointment made for tomorrow at 3:30 pm for evaluation. Father also had tight frenulum clipped as infant. To use nipple shield for feedings to protect nipple until tomorrow.. No questions at present. To call prn  Follow-Up      Pamelia Hoit 10/03/2011, 4:56 PM

## 2011-11-14 DIAGNOSIS — Z8719 Personal history of other diseases of the digestive system: Secondary | ICD-10-CM

## 2011-11-14 DIAGNOSIS — D649 Anemia, unspecified: Secondary | ICD-10-CM

## 2011-11-14 HISTORY — DX: Personal history of other diseases of the digestive system: Z87.19

## 2011-11-14 HISTORY — DX: Anemia, unspecified: D64.9

## 2012-02-21 ENCOUNTER — Ambulatory Visit: Payer: Self-pay | Admitting: Obstetrics and Gynecology

## 2012-04-16 ENCOUNTER — Encounter: Payer: Self-pay | Admitting: Obstetrics and Gynecology

## 2012-04-16 ENCOUNTER — Ambulatory Visit (INDEPENDENT_AMBULATORY_CARE_PROVIDER_SITE_OTHER): Payer: Managed Care, Other (non HMO) | Admitting: Obstetrics and Gynecology

## 2012-04-16 VITALS — BP 110/66 | Ht 65.0 in | Wt 133.0 lb

## 2012-04-16 DIAGNOSIS — Z124 Encounter for screening for malignant neoplasm of cervix: Secondary | ICD-10-CM

## 2012-04-16 DIAGNOSIS — K644 Residual hemorrhoidal skin tags: Secondary | ICD-10-CM

## 2012-04-16 NOTE — Progress Notes (Signed)
Last Pap: 03/03/11 WNL: Yes Regular Periods:no Contraception: none  Monthly Breast exam:no Tetanus<36yrs:yes ? Nl.Bladder Function:yes Daily BMs:no constipation Healthy Diet:yes Calcium:no Mammogram:no Date of Mammogram: n/a Exercise:yes Have often Exercise: 2-3 x weekly Seatbelt: yes Abuse at home: no Stressful work:no Sigmoid-colonoscopy: n/a Bone Density: No PCP: none Change in PMH: no change  Change in FMH:no change BMI-22  Subjective:    Lynn Ibarra is a 41 y.o. female, G1P1001, who presents for an annual exam.     History   Social History  . Marital Status: Married    Spouse Name: N/A    Number of Children: N/A  . Years of Education: N/A   Social History Main Topics  . Smoking status: Never Smoker   . Smokeless tobacco: Never Used  . Alcohol Use: No  . Drug Use: No  . Sexually Active: Yes   Other Topics Concern  . None   Social History Narrative  . None    Menstrual cycle:   LMP: No LMP recorded. Patient is not currently having periods (Reason: Lactating).           Cycle: none since delivery  The following portions of the patient's history were reviewed and updated as appropriate: allergies, current medications, past family history, past medical history, past social history, past surgical history and problem list.  Review of Systems Pertinent items are noted in HPI. Breast:Negative for breast lump,nipple discharge or nipple retraction Gastrointestinal: Negative for abdominal pain, change in bowel habits or rectal bleeding Urinary:negative   Objective:    BP 110/66  Ht 5\' 5"  (1.651 m)  Wt 133 lb (60.328 kg)  BMI 22.13 kg/m2    Weight:  Wt Readings from Last 1 Encounters:  04/16/12 133 lb (60.328 kg)          BMI: Body mass index is 22.13 kg/(m^2).  General Appearance: Alert, appropriate appearance for age. No acute distress HEENT: Grossly normal Neck / Thyroid: Supple, no masses, nodes or enlargement Lungs: clear to auscultation  bilaterally Back: No CVA tenderness Breast Exam: No masses or nodes.No dimpling, nipple retraction or discharge. Cardiovascular: Regular rate and rhythm. S1, S2, no murmur Gastrointestinal: Soft, non-tender, no masses or organomegaly Pelvic Exam: Vulva and vagina appear normal. Bimanual exam reveals normal uterus and adnexa. Rectovaginal: normal rectal, no masses Lymphatic Exam: Non-palpable nodes in neck, clavicular, axillary, or inguinal regions Skin: no rash or abnormalities Neurologic: Normal gait and speech, no tremor  Psychiatric: Alert and oriented, appropriate affect.   Wet Prep:not applicable Urinalysis:not applicable UPT: Not done   Assessment:    Normal gyn exam Nursing with amenorrhea    Plan:    Mammogram to be done 6 months after nursing complete pap smear with HPV.  No hx abnl return annually or prn STD screening: declined Contraception:declined      Janyra Barillas PMD

## 2012-04-17 LAB — PAP IG AND HPV HIGH-RISK

## 2013-05-06 ENCOUNTER — Ambulatory Visit (INDEPENDENT_AMBULATORY_CARE_PROVIDER_SITE_OTHER): Payer: Managed Care, Other (non HMO) | Admitting: Internal Medicine

## 2013-05-06 DIAGNOSIS — Z7184 Encounter for health counseling related to travel: Secondary | ICD-10-CM | POA: Insufficient documentation

## 2013-05-06 DIAGNOSIS — Z789 Other specified health status: Secondary | ICD-10-CM

## 2013-05-06 DIAGNOSIS — Z23 Encounter for immunization: Secondary | ICD-10-CM

## 2013-05-06 MED ORDER — AZITHROMYCIN 500 MG PO TABS: 500.0000 mg | ORAL_TABLET | Freq: Every day | ORAL | Status: DC

## 2013-05-06 NOTE — Progress Notes (Signed)
  Subjective:    Lynn Ibarra is a 42 y.o. female who presents to the Infectious Disease clinic for travel consultation. Planned departure date: August 9          Planned return date: 1 week Countries of travel: [place]  Areas in country: urban   Accommodations: hotel Purpose of travel: business/ office work Prior travel out of [place]: yes Currently ill / Fever: no History of liver or kidney disease: no  Data Review:  Lab test(s) reviewed: varicella immune, not sure about Hepatitis A and B vaccine   Review of Systems n/a    Objective:    n/a    Assessment:    No contraindications to travel. none      Plan:    Issues discussed: future shots, insect-borne illnesses, Japanese encephalitis, malaria, MVA safety, rabies, safe food/water, traveler's diarrhea, website/handouts for more information, what to do if ill upon return and what to do if ill while there. Immunizations recommended: Hepatitis A series, Td and Typhoid (parenteral). Malaria prophylaxis: not indicated Traveler's diarrhea prophylaxis: azithromycin.

## 2013-09-19 ENCOUNTER — Encounter (HOSPITAL_COMMUNITY): Payer: Self-pay

## 2013-09-19 ENCOUNTER — Other Ambulatory Visit: Payer: Self-pay | Admitting: Obstetrics and Gynecology

## 2013-09-19 DIAGNOSIS — O021 Missed abortion: Secondary | ICD-10-CM | POA: Diagnosis present

## 2013-09-20 ENCOUNTER — Encounter (HOSPITAL_COMMUNITY): Payer: Self-pay | Admitting: Pharmacy Technician

## 2013-09-20 ENCOUNTER — Encounter (HOSPITAL_COMMUNITY)
Admission: RE | Disposition: A | Discharge status: Home / Self Care | Payer: Self-pay | Source: Ambulatory Visit | Attending: Obstetrics and Gynecology

## 2013-09-20 ENCOUNTER — Ambulatory Visit (HOSPITAL_COMMUNITY): Payer: Managed Care, Other (non HMO) | Admitting: Anesthesiology

## 2013-09-20 ENCOUNTER — Encounter (HOSPITAL_COMMUNITY): Payer: Self-pay | Admitting: *Deleted

## 2013-09-20 ENCOUNTER — Ambulatory Visit (HOSPITAL_COMMUNITY)
Admission: RE | Admit: 2013-09-20 | Discharge: 2013-09-20 | Disposition: A | Discharge status: Home / Self Care | Payer: Managed Care, Other (non HMO) | Source: Ambulatory Visit | Attending: Obstetrics and Gynecology | Admitting: Obstetrics and Gynecology

## 2013-09-20 ENCOUNTER — Encounter (HOSPITAL_COMMUNITY): Payer: Managed Care, Other (non HMO) | Admitting: Anesthesiology

## 2013-09-20 DIAGNOSIS — Z6791 Unspecified blood type, Rh negative: Secondary | ICD-10-CM | POA: Diagnosis present

## 2013-09-20 DIAGNOSIS — O021 Missed abortion: Secondary | ICD-10-CM | POA: Diagnosis present

## 2013-09-20 HISTORY — DX: Gastro-esophageal reflux disease without esophagitis: K21.9

## 2013-09-20 HISTORY — PX: DILATION AND EVACUATION: SHX1459

## 2013-09-20 LAB — CBC
HCT: 37.7 % (ref 36.0–46.0)
MCH: 31.4 pg (ref 26.0–34.0)
MCHC: 35 g/dL (ref 30.0–36.0)
Platelets: 243 10*3/uL (ref 150–400)
RDW: 12.4 % (ref 11.5–15.5)
WBC: 8.9 10*3/uL (ref 4.0–10.5)

## 2013-09-20 LAB — TYPE AND SCREEN: ABO/RH(D): O NEG

## 2013-09-20 SURGERY — DILATION AND EVACUATION, UTERUS
Anesthesia: Monitor Anesthesia Care

## 2013-09-20 MED ORDER — KETOROLAC TROMETHAMINE 30 MG/ML IJ SOLN: 15.0000 mg | Freq: Once | INTRAMUSCULAR | Status: DC | PRN

## 2013-09-20 MED ORDER — KETOROLAC TROMETHAMINE 30 MG/ML IJ SOLN
INTRAMUSCULAR | Status: AC
  Filled 2013-09-20: qty 2

## 2013-09-20 MED ORDER — LIDOCAINE HCL 2 % IJ SOLN
INTRAMUSCULAR | Status: AC
  Filled 2013-09-20: qty 20

## 2013-09-20 MED ORDER — PROPOFOL 10 MG/ML IV EMUL
INTRAVENOUS | Status: AC
  Filled 2013-09-20: qty 40

## 2013-09-20 MED ORDER — METHYLERGONOVINE MALEATE 0.2 MG PO TABS: 0.2000 mg | ORAL_TABLET | Freq: Four times a day (QID) | ORAL | Status: AC

## 2013-09-20 MED ORDER — LIDOCAINE HCL 2 % IJ SOLN
INTRAMUSCULAR | Status: DC | PRN
  Administered 2013-09-20: 10 mL
  Administered 2013-09-20: 4 mL

## 2013-09-20 MED ORDER — FENTANYL CITRATE 0.05 MG/ML IJ SOLN: 25.0000 ug | INTRAMUSCULAR | Status: DC | PRN

## 2013-09-20 MED ORDER — FENTANYL CITRATE 0.05 MG/ML IJ SOLN
INTRAMUSCULAR | Status: DC | PRN
  Administered 2013-09-20 (×2): 50 ug via INTRAVENOUS

## 2013-09-20 MED ORDER — ONDANSETRON HCL 4 MG/2ML IJ SOLN
INTRAMUSCULAR | Status: DC | PRN
  Administered 2013-09-20: 4 mg via INTRAVENOUS

## 2013-09-20 MED ORDER — MIDAZOLAM HCL 2 MG/2ML IJ SOLN
INTRAMUSCULAR | Status: AC
  Filled 2013-09-20: qty 2

## 2013-09-20 MED ORDER — FENTANYL CITRATE 0.05 MG/ML IJ SOLN
INTRAMUSCULAR | Status: AC
  Filled 2013-09-20: qty 2

## 2013-09-20 MED ORDER — MIDAZOLAM HCL 2 MG/2ML IJ SOLN
INTRAMUSCULAR | Status: DC | PRN
  Administered 2013-09-20: 2 mg via INTRAVENOUS

## 2013-09-20 MED ORDER — RHO D IMMUNE GLOBULIN 1500 UNIT/2ML IJ SOLN
300.0000 ug | Freq: Once | INTRAMUSCULAR | Status: AC
  Administered 2013-09-20: 300 ug via INTRAMUSCULAR

## 2013-09-20 MED ORDER — SILVER NITRATE-POT NITRATE 75-25 % EX MISC
CUTANEOUS | Status: DC | PRN
  Administered 2013-09-20: 1

## 2013-09-20 MED ORDER — LACTATED RINGERS IV SOLN
INTRAVENOUS | Status: DC
  Administered 2013-09-20 (×2): via INTRAVENOUS

## 2013-09-20 MED ORDER — PROMETHAZINE HCL 25 MG/ML IJ SOLN: 6.2500 mg | INTRAMUSCULAR | Status: DC | PRN

## 2013-09-20 MED ORDER — KETOROLAC TROMETHAMINE 30 MG/ML IJ SOLN
INTRAMUSCULAR | Status: DC | PRN
  Administered 2013-09-20: 30 mg via INTRAMUSCULAR
  Administered 2013-09-20: 30 mg via INTRAVENOUS

## 2013-09-20 MED ORDER — MIDAZOLAM HCL 2 MG/2ML IJ SOLN: 0.5000 mg | Freq: Once | INTRAMUSCULAR | Status: DC | PRN

## 2013-09-20 MED ORDER — MEPERIDINE HCL 25 MG/ML IJ SOLN: 6.2500 mg | INTRAMUSCULAR | Status: DC | PRN

## 2013-09-20 MED ORDER — IBUPROFEN 600 MG PO TABS: ORAL_TABLET | ORAL | Status: DC

## 2013-09-20 MED ORDER — DOXYCYCLINE HYCLATE 100 MG PO TABS: 100.0000 mg | ORAL_TABLET | Freq: Two times a day (BID) | ORAL | Status: AC

## 2013-09-20 MED ORDER — PROPOFOL 10 MG/ML IV EMUL
INTRAVENOUS | Status: DC | PRN
  Administered 2013-09-20: 40 mg via INTRAVENOUS
  Administered 2013-09-20 (×6): 20 mg via INTRAVENOUS
  Administered 2013-09-20: 40 mg via INTRAVENOUS
  Administered 2013-09-20 (×2): 20 mg via INTRAVENOUS

## 2013-09-20 MED ORDER — SILVER NITRATE-POT NITRATE 75-25 % EX MISC
CUTANEOUS | Status: AC
  Filled 2013-09-20: qty 1

## 2013-09-20 SURGICAL SUPPLY — 21 items
CATH ROBINSON RED A/P 16FR (CATHETERS) ×2 IMPLANT
CLOTH BEACON ORANGE TIMEOUT ST (SAFETY) ×2 IMPLANT
DECANTER SPIKE VIAL GLASS SM (MISCELLANEOUS) ×2 IMPLANT
DILATOR CANAL MILEX (MISCELLANEOUS) IMPLANT
GLOVE SURG SS PI 6.5 STRL IVOR (GLOVE) ×4 IMPLANT
GOWN STRL REIN XL XLG (GOWN DISPOSABLE) ×4 IMPLANT
KIT BERKELEY 1ST TRIMESTER 3/8 (MISCELLANEOUS) ×2 IMPLANT
NEEDLE SPNL 22GX3.5 QUINCKE BK (NEEDLE) ×2 IMPLANT
NS IRRIG 1000ML POUR BTL (IV SOLUTION) ×2 IMPLANT
PACK VAGINAL MINOR WOMEN LF (CUSTOM PROCEDURE TRAY) ×2 IMPLANT
PAD OB MATERNITY 4.3X12.25 (PERSONAL CARE ITEMS) ×2 IMPLANT
PAD PREP 24X48 CUFFED NSTRL (MISCELLANEOUS) ×2 IMPLANT
SET BERKELEY SUCTION TUBING (SUCTIONS) ×2 IMPLANT
SUT VIC AB 2-0 CT1 27 (SUTURE) ×1
SUT VIC AB 2-0 CT1 TAPERPNT 27 (SUTURE) ×1 IMPLANT
SYR CONTROL 10ML LL (SYRINGE) ×2 IMPLANT
TOWEL OR 17X24 6PK STRL BLUE (TOWEL DISPOSABLE) ×4 IMPLANT
VACURETTE 10 RIGID CVD (CANNULA) IMPLANT
VACURETTE 7MM CVD STRL WRAP (CANNULA) IMPLANT
VACURETTE 8 RIGID CVD (CANNULA) ×2 IMPLANT
VACURETTE 9 RIGID CVD (CANNULA) IMPLANT

## 2013-09-20 NOTE — Brief Op Note (Signed)
09/20/2013  2:17 PM  PATIENT:  Lynn Ibarra  42 y.o. female  PRE-OPERATIVE DIAGNOSIS:  missed ab, Rh- blood type  POST-OPERATIVE DIAGNOSIS:  missed ab, Rh- blood type  PROCEDURE:  Procedure(s): DILATATION AND EVACUATION WITH FROZEN SECTION (N/A)  SURGEON:  Surgeon(s) and Role:    * Hal Morales, MD - Primary      ANESTHESIA:   local and MAC  EBL:  Total I/O In: 1000 [I.V.:1000] Out: 110 [Urine:60; Blood:50]  BLOOD ADMINISTERED:none   LOCAL MEDICATIONS USED:  LIDOCAINE  and Amount: 14 ml  SPECIMEN:  Source of Specimen:  products of conception  DISPOSITION OF SPECIMEN:  PATHOLOGY  COUNTS:  YES   PLAN OF CARE: Rh immune globulin to be given prior to discharge  Discharge to home after PACU  PATIENT DISPOSITION:  PACU - hemodynamically stable.   Delay start of Pharmacological VTE agent (>24hrs) due to surgical blood loss or risk of bleeding: not applicable  SCDs used during procedure  Procedure the patient was taken to the operating room after appropriate identification placed on the operating table. After the attainment of adequate equipment for monitored anesthesia care she was placed in the lithotomy position. The perineum and vagina were prepped with multiple layers of Betadine and the bladder was emptied with a Robinson catheter. The perineum was draped as a sterile field. A Graves speculum in the vagina after an examination under anesthesia revealed a retroverted uterus. A paracervical block was achieved with a total of 10 cc of 2% Xylocaine in the 5 and 7:00 positions. The cervix was grasped with a single-tooth tenaculum on the anterior lip. The cervix was dilated to accommodate a #8 suction catheter. The suction catheter was used to evacuate all quadrants of the uterus. The tissue was sent for frozen section. A sharp curet was used to ensure that all tissue had been removed from the uterus. The single-tooth tenaculum was removed with significant bleeding from the  site. Initially silver nitrate was applied without adequate hemostasis. Subsequently a suture of 2-0 Vicryl was placed in a figure-of-eight fashion with adequate hemostasis. Upon removal of the speculum it was noticed that the silver nitrate had touched the left vulva and that area was infiltrated with 4 cc of 2% Xylocaine. Once all instruments had been removed, sponge and instrument counts were noted to be correct and the patient was awakened from monitored anesthesia care and taken to the recovery room in satisfactory condition having tolerated the procedure well sponge and instrument counts correct.  Specimen to pathology: products of conception  confirmed by frozen section

## 2013-09-20 NOTE — Anesthesia Preprocedure Evaluation (Signed)
Anesthesia Evaluation  Patient identified by MRN, date of birth, ID band Patient awake    Reviewed: Allergy & Precautions, H&P , Patient's Chart, lab work & pertinent test results, reviewed documented beta blocker date and time   History of Anesthesia Complications Negative for: history of anesthetic complications  Airway Mallampati: II TM Distance: >3 FB Neck ROM: full    Dental   Pulmonary  breath sounds clear to auscultation        Cardiovascular Exercise Tolerance: Good Rhythm:regular Rate:Normal     Neuro/Psych negative psych ROS   GI/Hepatic   Endo/Other    Renal/GU      Musculoskeletal   Abdominal   Peds  Hematology   Anesthesia Other Findings   Reproductive/Obstetrics                           Anesthesia Physical Anesthesia Plan  ASA: I  Anesthesia Plan: MAC   Post-op Pain Management:    Induction:   Airway Management Planned:   Additional Equipment:   Intra-op Plan:   Post-operative Plan:   Informed Consent: I have reviewed the patients History and Physical, chart, labs and discussed the procedure including the risks, benefits and alternatives for the proposed anesthesia with the patient or authorized representative who has indicated his/her understanding and acceptance.   Dental Advisory Given  Plan Discussed with: CRNA, Surgeon and Anesthesiologist  Anesthesia Plan Comments:         Anesthesia Quick Evaluation  

## 2013-09-20 NOTE — Transfer of Care (Signed)
Immediate Anesthesia Transfer of Care Note  Patient: Lynn Ibarra  Procedure(s) Performed: Procedure(s): DILATATION AND EVACUATION WITH FROZEN SECTION (N/A)  Patient Location: PACU  Anesthesia Type:MAC  Level of Consciousness: awake, alert  and oriented  Airway & Oxygen Therapy: Patient Spontanous Breathing  Post-op Assessment: Report given to PACU RN and Post -op Vital signs reviewed and stable  Post vital signs: Reviewed and stable  Complications: No apparent anesthesia complications

## 2013-09-20 NOTE — H&P (Signed)
Lynn Ibarra is an 42 y.o. female. preseents form management of probable failed IUP.  Had positive pregnancy test and should be 8 wks by LMP, but no gestational sac, yolk sac or fetal pole seen on u/s  Pertinent Gynecological History: Menses: regular every month without intermenstrual spotting DES exposure: denies Blood transfusions: none Sexually transmitted diseases: no past history Previous GYN Procedures: none  Last mammogram: normal Date: 2014 Last pap: normal Date: 2013 OB History: G2, P1  Menstrual History:  Patient's last menstrual period was 07/27/2013.    Past Medical History  Diagnosis Date  . AMA (advanced maternal age) multigravida 35+   . H/O candidiasis   . Abnormal Pap smear 2010  . Hx: UTI (urinary tract infection)     Occasionally  . H/O bladder infections   . H/O varicella   . Yeast infection   . H/O constipation 11/14/11  . Pneumonia     childhood  . GERD (gastroesophageal reflux disease)     with pregnancy  . Anemia 11/14/11    with pregnancy now resolved    Past Surgical History  Procedure Laterality Date  . Colposcopy      x2  . Lasik  2001  . Eye surgery      lasic    Family History  Problem Relation Age of Onset  . Peripheral vascular disease Mother   . Hypertension Father   . Peripheral vascular disease Father   . Kidney disease Father     nephrectomy  . Cancer Father     kidney  . Heart disease Paternal Aunt   . Depression Paternal Aunt   . Cancer Paternal Aunt     ovarian  . Hypertension Maternal Grandmother   . Diabetes Maternal Grandmother   . Seizures Maternal Grandmother     tia  . Stroke Maternal Grandmother   . Cancer Paternal Grandfather     prostate  . Stroke Maternal Grandfather     Social History:  reports that she has never smoked. She has never used smokeless tobacco. She reports that she drinks alcohol. She reports that she does not use illicit drugs.  Allergies:  Allergies  Allergen Reactions  . Other  Itching and Other (See Comments)    Nuts (peanuts, almonds); mild reaction only.    Prescriptions prior to admission  Medication Sig Dispense Refill  . docusate sodium (COLACE) 100 MG capsule Take 200 mg by mouth daily as needed for mild constipation.      . norethindrone (ORTHO MICRONOR) 0.35 MG tablet Take 1 tablet (0.35 mg total) by mouth daily.  1 Package  11  . Prenatal Vit-Fe Fumarate-FA (PRENATAL MULTIVITAMIN) TABS tablet Take 1 tablet by mouth daily at 12 noon.        Review of Systems  Constitutional: Negative.   HENT: Negative.   Eyes: Negative.   Respiratory: Negative.   Cardiovascular: Negative.   Gastrointestinal: Negative.   Genitourinary: Negative.   Musculoskeletal: Negative.   Skin: Negative.   Neurological: Negative.   Endo/Heme/Allergies: Negative.   Psychiatric/Behavioral: Negative.     Blood pressure 105/65, pulse 79, temperature 97.7 F (36.5 C), temperature source Oral, resp. rate 18, height 5\' 5"  (1.651 m), weight 130 lb (58.968 kg), last menstrual period 07/27/2013, SpO2 100.00%. Physical Exam  Constitutional: She is oriented to person, place, and time. She appears well-developed and well-nourished.  HENT:  Head: Normocephalic and atraumatic.  Eyes: Conjunctivae and EOM are normal.  Neck: Normal range of motion. Neck supple. No thyromegaly  present.  Cardiovascular: Normal rate and regular rhythm.   Respiratory: Effort normal and breath sounds normal.  GI: Soft. Bowel sounds are normal.  Musculoskeletal: Normal range of motion.  Neurological: She is alert and oriented to person, place, and time.  Skin: Skin is warm and dry.  Psychiatric:  Appropriate affect for current loss    Results for orders placed during the hospital encounter of 09/20/13 (from the past 24 hour(s))  CBC     Status: None   Collection Time    09/20/13 11:20 AM      Result Value Range   WBC 8.9  4.0 - 10.5 K/uL   RBC 4.20  3.87 - 5.11 MIL/uL   Hemoglobin 13.2  12.0 - 15.0  g/dL   HCT 16.1  09.6 - 04.5 %   MCV 89.8  78.0 - 100.0 fL   MCH 31.4  26.0 - 34.0 pg   MCHC 35.0  30.0 - 36.0 g/dL   RDW 40.9  81.1 - 91.4 %   Platelets 243  150 - 400 K/uL     Assessment/Plan: Probable failed pregnancy first trimester Options were reviewed with the patient and her husband and she opted to proceed with D&E for failed pregnancy. Because there was evidence that. HCG was continuing to increase. A frozen section. Will be done.  Jeimy Bickert P 09/20/2013, 1:21 PM

## 2013-09-20 NOTE — Anesthesia Postprocedure Evaluation (Signed)
Anesthesia Post Note  Patient: Lynn Ibarra  Procedure(s) Performed: Procedure(s) (LRB): DILATATION AND EVACUATION WITH FROZEN SECTION (N/A)  Anesthesia type: MAC  Patient location: PACU  Post pain: Pain level controlled  Post assessment: Post-op Vital signs reviewed  Last Vitals:  Filed Vitals:   09/20/13 1445  BP: 93/53  Pulse: 66  Temp:   Resp: 19    Post vital signs: Reviewed  Level of consciousness: sedated  Complications: No apparent anesthesia complications

## 2013-09-21 LAB — RH IG WORKUP (INCLUDES ABO/RH)
ABO/RH(D): O NEG
Gestational Age(Wks): 8
Unit division: 0

## 2013-09-23 ENCOUNTER — Encounter (HOSPITAL_COMMUNITY): Payer: Self-pay | Admitting: Obstetrics and Gynecology

## 2013-09-27 DIAGNOSIS — O019 Hydatidiform mole, unspecified: Secondary | ICD-10-CM | POA: Insufficient documentation

## 2013-09-27 NOTE — Op Note (Signed)
09/20/2013  12:50 PM  PATIENT:  Lynn Ibarra  42 y.o. female  PRE-OPERATIVE DIAGNOSIS:  missed abortion      Rh- blood type  POST-OPERATIVE DIAGNOSIS:  missed abortion.      Rh- blood type  PROCEDURE:  Procedure(s): DILATATION AND EVACUATION WITH FROZEN SECTION  SURGEON:  Surgeon(s): Hal Morales, MD  ASSISTANTS: none   ANESTHESIA:   paracervical block and MAC  ESTIMATED BLOOD LOSS: Less than 100 cc  COMPLICATIONS: None  FINDINGS: A moderate amount of tissue was obtained. At the time of D&C. The frozen section contained chorionic villi  BLOOD ADMINISTERED:none  LOCAL MEDICATIONS USED:  LIDOCAINE  and Amount: 10 ml  SPECIMEN:  Source of Specimen:  Uterine contents  DISPOSITION OF SPECIMEN:  PATHOLOGY  COUNTS:  YES  DESCRIPTION OF PROCEDURE:  The patient was taken the operating room after appropriate identification placed on the operating table. After the attainment of adequate anesthesia she was placed in the lithotomy position. Perineum and vagina were prepped with multiple areas of Betadine and a straight in and out catheter used to empty the bladder. The perineum was draped as a sterile field. A weighted speculum was placed in the posterior vagina and a paracervical block achieved a total of 10 cc of 2% Xylocaine and the 5 and 7:00 positions. A single-tooth tenaculum was placed on the anterior cervix and the cervix dilated to accommodate a number 8 suction catheter. The suction catheter was then used to suction evacuate all quadrants of the uterus. A sharp curet was used to ensure that all products of conception had been removed. All instruments were then removed from the vagina and the patient had her anesthetic reversed and was taken to the recovery room in satisfactory condition having tolerated the procedure well sponge and instrument counts correct.  PLAN OF CARE: Discharge home after postanesthesia care        Rhophylac given prior to discharge  PATIENT  DISPOSITION:  PACU - hemodynamically stable.   Delay start of Pharmacological VTE agent (>24hrs) due to surgical blood loss or risk of bleeding:  not applicable. Due to brevity of the procedure    Hal Morales, MD 12:50 PM

## 2013-10-02 ENCOUNTER — Encounter (HOSPITAL_COMMUNITY): Payer: Self-pay

## 2013-11-14 ENCOUNTER — Encounter (INDEPENDENT_AMBULATORY_CARE_PROVIDER_SITE_OTHER): Payer: Self-pay

## 2013-11-14 ENCOUNTER — Ambulatory Visit: Payer: Managed Care, Other (non HMO) | Attending: Gynecologic Oncology | Admitting: Gynecologic Oncology

## 2013-11-14 ENCOUNTER — Ambulatory Visit: Payer: Managed Care, Other (non HMO)

## 2013-11-14 VITALS — BP 118/74 | HR 78 | Temp 97.5°F | Resp 16 | Wt 137.8 lb

## 2013-11-14 DIAGNOSIS — O019 Hydatidiform mole, unspecified: Secondary | ICD-10-CM

## 2013-11-14 DIAGNOSIS — R232 Flushing: Secondary | ICD-10-CM | POA: Insufficient documentation

## 2013-11-14 LAB — CBC WITH DIFFERENTIAL/PLATELET
BASO%: 0.3 % (ref 0.0–2.0)
BASOS ABS: 0 10*3/uL (ref 0.0–0.1)
EOS%: 1.1 % (ref 0.0–7.0)
Eosinophils Absolute: 0.1 10*3/uL (ref 0.0–0.5)
HCT: 43.9 % (ref 34.8–46.6)
HGB: 14.9 g/dL (ref 11.6–15.9)
LYMPH%: 25.5 % (ref 14.0–49.7)
MCH: 32.3 pg (ref 25.1–34.0)
MCHC: 33.9 g/dL (ref 31.5–36.0)
MCV: 95.3 fL (ref 79.5–101.0)
MONO#: 0.7 10*3/uL (ref 0.1–0.9)
MONO%: 8.2 % (ref 0.0–14.0)
NEUT%: 64.9 % (ref 38.4–76.8)
NEUTROS ABS: 5.4 10*3/uL (ref 1.5–6.5)
Platelets: 198 10*3/uL (ref 145–400)
RBC: 4.61 10*6/uL (ref 3.70–5.45)
RDW: 12.7 % (ref 11.2–14.5)
WBC: 8.3 10*3/uL (ref 3.9–10.3)
lymph#: 2.1 10*3/uL (ref 0.9–3.3)

## 2013-11-14 LAB — COMPREHENSIVE METABOLIC PANEL (CC13)
ALBUMIN: 4.2 g/dL (ref 3.5–5.0)
ALT: 13 U/L (ref 0–55)
AST: 16 U/L (ref 5–34)
Alkaline Phosphatase: 58 U/L (ref 40–150)
Anion Gap: 8 mEq/L (ref 3–11)
BUN: 11.7 mg/dL (ref 7.0–26.0)
CALCIUM: 9.6 mg/dL (ref 8.4–10.4)
CO2: 23 mEq/L (ref 22–29)
Chloride: 107 mEq/L (ref 98–109)
Creatinine: 0.9 mg/dL (ref 0.6–1.1)
Glucose: 97 mg/dl (ref 70–140)
POTASSIUM: 4.6 meq/L (ref 3.5–5.1)
Sodium: 138 mEq/L (ref 136–145)
Total Bilirubin: 0.56 mg/dL (ref 0.20–1.20)
Total Protein: 7.3 g/dL (ref 6.4–8.3)

## 2013-11-14 NOTE — Progress Notes (Signed)
Consult Note: Gyn-Onc  Consult was requested by Dr. Leo Grosser for the evaluation of Lynn Ibarra 43 y.o. female CC:  Chief Complaint  Patient presents with  . 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 Significant findings are that her age is 43 years, the antecedent pregnancy was a live born infant at term 2 years ago, the highest pretreatment  hCG is 52,590, the  uterine size at the time of D&C was likely 8-10 cm given the fact that a #8 suction catheter was used.  A chest x-ray and CT of the abdomen and pelvis have been ordered in addition to the WBC and chemistry panel. WHO score will be calculated when the additional information is obtained.  The options presented to [name] and her husband if no metastatic disease is identified and her WHO is  remains =1 (low risk,  non metastatic)  were 1: repeat D&C  2: single agent chemotherapy,  either weekly methotrexate with folic acid rescue or actinomycin D every two-weeks or 3: hysterectomy.  They're not sure whether they are satisfied with parity and wish to proceed with a repeat D&C. They understand that if there is no further fall of the hCG then single agent chemotherapy will be recommended.  If metastatic disease is noted on imaging the  WHO score will  determine the level of risk of this gestational trophoblastic neoplasia and dictate the number of agents necessary for treatment.  I reassured them that this is one of the most treatable and curable malignancies.  All of their questions were answered to their satisfaction.  Follow-up after D&C Beta HCG weekly after D&C until normalization Advised to use an effective and reliable form of birth control until one year post normalization of beta HCG.  History of Present Illness:   [name]  is a 43 y.o.  gravida 2 para 1 last normal menstrual period 07/27/2013. On 09/27/2013 she underwent dilation  and evacuation for presumed missed AB. The highest beta hCG prior to that procedure was 52,590 on 09/17/2013 .  At the time of uterine evacuate evacuation the presence of chorionic villi was confirmed.  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  Subsequent beta hCGs are as follows  10/02/2014  301.4 10/11/2013 41.7 10/23/2013  25.94 10/30/2013  56.8 11/05/2013  74.8 11/13/2013  92.4  [name]'s blood type is O- the last pregnancy was 2 years ago that resulted in a live born infant at term, she denies cough, shortness of breath, hemoptysis.  She denies abdominal pain or vaginal bleeding.  She is undecided regarding satisfaction of her parity.    She received RhoGAM after the D&C.    Current Meds:  Outpatient Encounter Prescriptions as of 11/14/2013  Medication Sig  . docusate sodium (COLACE) 100 MG capsule Take 200 mg by mouth daily as needed for mild constipation.  Marland Kitchen ibuprofen (ADVIL,MOTRIN) 600 MG tablet Ibuprofen 600 mg every 6 hours for 3 days and then every 6  hours as needed for pain  . [DISCONTINUED] doxycycline (VIBRA-TABS) 100 MG tablet   . [DISCONTINUED] LUTERA 0.1-20 MG-MCG tablet   . [DISCONTINUED] methylergonovine (METHERGINE) 0.2 MG tablet   . [DISCONTINUED] Prenatal Vit-Fe Fumarate-FA (PRENATAL MULTIVITAMIN) TABS tablet Take 1 tablet by mouth daily at 12 noon.    Allergy:  Allergies  Allergen Reactions  . Other Itching and Other (See Comments)    Nuts (peanuts, almonds); mild reaction  only.    Social Hx:   History   Social History  . Marital Status: Married    Spouse Name: N/A    Number of Children: N/A  . Years of Education: N/A   Occupational History  . Not on file.   Social History Main Topics  . Smoking status: Never Smoker   . Smokeless tobacco: Never Used  . Alcohol Use: Yes     Comment: socially  . Drug Use: No  . Sexual Activity: Yes   Other Topics Concern  . Not on file   Social History Narrative  . No narrative on file     Past Surgical Hx:  Past Surgical History  Procedure Laterality Date  . Colposcopy      x2  . Lasik  2001  . Eye surgery      lasic  . Dilation and evacuation N/A 09/20/2013    Procedure: DILATATION AND EVACUATION WITH FROZEN SECTION;  Surgeon: Eldred Manges, MD;  Location: [facility];  Service: Gynecology;  Laterality: N/A;    Past Medical Hx:  Past Medical History  Diagnosis Date  . AMA (advanced maternal age) multigravida 17+   . H/O candidiasis   . Abnormal Pap smear 2010  . Hx: UTI (urinary tract infection)     Occasionally  . H/O bladder infections   . H/O varicella   . Yeast infection   . H/O constipation 11/14/11  . Pneumonia     childhood  . GERD (gastroesophageal reflux disease)     with pregnancy  . Anemia 11/14/11    with pregnancy now resolved    Past Gynecological History:  G2 P1 Last pregnancy was at term 2 years ago.  Patient's last menstrual period was 07/27/2013.  Family Hx:  Family History  Problem Relation Age of Onset  . Peripheral vascular disease Mother   . Hypertension Father   . Peripheral vascular disease Father   . Kidney disease Father     nephrectomy  . Cancer Father     kidney  . Heart disease Paternal Aunt   . Depression Paternal Aunt   . Cancer Paternal Aunt     ovarian  . Hypertension Maternal Grandmother   . Diabetes Maternal Grandmother   . Seizures Maternal Grandmother     tia  . Stroke Maternal Grandmother   . Cancer Paternal Grandfather     prostate  . Stroke Maternal Grandfather     Review of Systems:  Constitutional  Feels well, Cardiovascular  No chest pain, shortness of breath, or edema  Pulmonary  No cough or hemoptysis Gastro Intestinal  No nausea, vomitting, or diarrhoea. No bright red blood per rectum, no abdominal pain, change in bowel movement, or constipation.  Genito Urinary  No vaginal bleeding or discharge    Vitals:  Blood pressure 118/74, pulse 78, temperature 97.5 F (36.4 C),  temperature source Oral, resp. rate 16, weight 137 lb 12.8 oz (62.506 kg), last menstrual period 07/27/2013.  Physical Exam: WD in NAD, skin flushing noted,  Neck  Supple NROM, without any enlargements.  Lymph Node Survey No cervical supraclavicular or inguinal adenopathy Cardiovascular  Pulse normal rate, regularity and rhythm. S1 and S2 normal.  Lungs  Clear to auscultation bilaterally  Good air movement.  Psychiatry  Alert and oriented to person, place, and time, appropriate mood affect and speech. Abdomen  Normoactive bowel sounds, abdomen soft, non-tender. Back No CVA tenderness Genito Urinary  Vulva/vagina: Normal external female genitalia.  No lesions.  Bladder/urethra:  No lesions or masses  Vagina: no lesions, no vaginal bleeding  Cervix: Normal appearing, no lesions.  Uterus: Small, mobile, no parametrial involvement or nodularity.  Adnexa: No palpable masses. Rectal  Good tone, no masses no cul de sac nodularity.  Extremities  No bilateral cyanosis, clubbing or edema.   Janie Morning, MD, PhD 11/14/2013, 10:45 AM

## 2013-11-14 NOTE — Patient Instructions (Addendum)
We will let you know about your lab results and CT scan results.  Dr. Caralee Ates office with contact you to arrange for a D&C in the near future.    Molar Pregnancy A molar pregnancy (hydatidiform mole) is a mass of tissue that grows in the uterus after conception. The mass is created by an egg that was not fertilized correctly and abnormally grows. It is an abnormal pregnancy and does not develop into a fetus. If a molar pregnancy is suspected by your health care provider, treatment is required. CAUSES  Molar pregnancy is caused by an egg that is fertilized incorrectly so that it has abnormal genetic material (chromosomes). This can result in one of 2 types of molar pregnancy:  Complete molar pregnancy All of the chromosomes in the fertilized egg come from the father; none come from the mother.  Partial molar pregnancy The fertilized egg has chromosomes from the father and mother, but it has too many chromosomes. RISK FACTORS  Certain risk factors make a molar pregnancy more likely. They include:   Being over age 14 or under age 21.  History of a molar pregnancy in the past (extremely small chance of recurrence). Other possible risk factors include:   Smoking more than 15 cigarettes per day.  History of infertility.  Having a certain blood type (A, B, AB).  Having a vitamin A deficiency.  Using oral contraceptives. SIGNS AND SYMPTOMS   Vaginal bleeding.  Missed menstrual period.  Uterus grows quicker than normal.  Severe nausea and vomiting.  Severe pressure or pain in the uterus.  Abnormal ovarian cysts (theca lutein cysts).  Discharge from the vagina that looks like grapes.  High blood pressure (early onset of preeclampsia).  Overactive thyroid (hyperthyroidism).  Anemia. DIAGNOSIS  If your health care provider thinks there is a chance of a molar pregnancy, testing will be recommended. Possible tests include:   An ultrasound test.  Blood tests. TREATMENT  Most  molar pregnancies end on their own by miscarriage. However, a health care provider needs to make sure that all the abnormal tissue is out of the womb. This can be done with dilation and curettage (D&C) or suction curettage. In this procedure, any remaining molar tissue is removed through the vagina. After diagnosis of a molar pregnancy, the pregnancy hormone levels must be followed until the level is zero. If the pregnancy hormone level does not drop appropriately, chemotherapy may be necessary. Also, you will be given a medicine called Rho(D) immune globulin if you are Rh negative and your sex partner is Rh positive. This helps prevent Rh problems in future pregnancies. HOME CARE INSTRUCTIONS   Avoid getting pregnant for 6 12 months or as directed by your health care provider. Use a reliable form of birth control or do not have sex.  Only take over-the-counter or prescription medicine as directed by your health care provider.  Keep all follow-up appointments and get all suggested lab tests and ultrasound tests.  Gradually return to normal activities.  Think about joining a support group. Ask for help if you are struggling with grief. Document Released: 06/21/2011 Document Revised: 07/24/2013 Document Reviewed: 05/02/2013 [facility] Patient Information 2014 [place], [place].   Methotrexate injection What is this medicine? METHOTREXATE (METH oh TREX ate) is a chemotherapy drug. This medicine affects cells that are rapidly growing, such as cancer cells and cells in your mouth and stomach. It is used to treat many cancers and other medical conditions. It is used for leukemias, lymphomas, breast cancer,  lung cancer, head and neck cancers, and other cancers. This medicine also works on the immune system and is commonly used to treat psoriasis and rheumatoid arthritis. This medicine may be used for other purposes; ask your health care provider or pharmacist if you have questions. What should I tell my  health care provider before I take this medicine? They need to know if you have any of these conditions: -if you frequently drink alcohol containing drinks -infection (especially a virus infection such as chickenpox, cold sores, or herpes) -immune system problems -kidney disease -liver disease -low blood counts, like platelets, red bloods, or white blood cells -lung disease -recent or ongoing radiation therapy -an unusual or allergic reaction to methotrexate, benzyl alcohol, other medicines, foods, dyes, or preservatives -pregnant or trying to get pregnant -breast-feeding  Dactinomycin, Actinomycin D injection What is this medicine? DACTINOMYCIN (dak ti noe MYE sin) is a chemotherapy drug. It is used to treat many kinds of cancer like Wilms' tumor, some sarcomas, and placental and testicular cancers. It is also used to treat other solid tumors. This medicine may be used for other purposes; ask your health care provider or pharmacist if you have questions. COMMON BRAND NAME(S): Cosmegen What should I tell my health care provider before I take this medicine? They need to know if you have any of these conditions: -infection (especially a virus infection such as chickenpox, cold sores, or herpes) -liver disease -low blood counts like low platelets, red blood cells, white blood cells -recent radiation therapy -an unusual or allergic reaction to dactinomycin, other chemotherapy agents, other medicines, foods, dyes, or preservatives -pregnant or trying to get pregnant -breast-feeding How should I use this medicine? This drug is given as an infusion into a vein. It is administered in a hospital or clinic by a specially trained health care professional. Talk to your pediatrician regarding the use of this medicine in children. While this drug may be prescribed for children as young as 27 months of age for selected conditions, precautions do apply. Overdosage: If you think you have taken too much  of this medicine contact a poison control center or emergency room at once. NOTE: This medicine is only for you. Do not share this medicine with others. What if I miss a dose? It is important not to miss your dose. Call your doctor or health care professional if you are unable to keep an appointment. What may interact with this medicine? -medicines to increase blood counts like filgrastim, pegfilgrastim, sargramostim -vaccines This list may not describe all possible interactions. Give your health care provider a list of all the medicines, herbs, non-prescription drugs, or dietary supplements you use. Also tell them if you smoke, drink alcohol, or use illegal drugs. Some items may interact with your medicine. What should I watch for while using this medicine? Your condition will be monitored carefully while you are receiving this medicine. You will need important blood work done while you are taking this medicine. This drug may make you feel generally unwell. This is not uncommon, as chemotherapy can affect healthy cells as well as cancer cells. Report any side effects. Continue your course of treatment even though you feel ill unless your doctor tells you to stop. Call your doctor or health care professional for advice if you get a fever, chills or sore throat, or other symptoms of a cold or flu. Do not treat yourself. This drug decreases your body's ability to fight infections. Try to avoid being around people who are  sick. This medicine may increase your risk to bruise or bleed. Call your doctor or health care professional if you notice any unusual bleeding. Be careful brushing and flossing your teeth or using a toothpick because you may get an infection or bleed more easily. If you have any dental work done, tell your dentist you are receiving this medicine. Avoid taking products that contain aspirin, acetaminophen, ibuprofen, naproxen, or ketoprofen unless instructed by your doctor. These medicines  may hide a fever. Do not become pregnant while taking this medicine. Women should inform their doctor if they wish to become pregnant or think they might be pregnant. There is a potential for serious side effects to an unborn child. Talk to your health care professional or pharmacist for more information. Do not breast-feed an infant while taking this medicine. What side effects may I notice from receiving this medicine? Side effects that you should report to your doctor or health care professional as soon as possible: -allergic reactions like skin rash, itching or hives, swelling of the face, lips, or tongue -low blood counts - this medicine may decrease the number of white blood cells, red blood cells and platelets. You may be at increased risk for infections and bleeding. -signs of infection - fever or chills, cough, sore throat, pain or difficulty passing urine -signs of decreased platelets or bleeding - bruising, pinpoint red spots on the skin, black, tarry stools, blood in the urine -signs of decreased red blood cells - unusually weak or tired, fainting spells, lightheadedness -breathing problems -changes in vision -chest pain -mouth sores -pain, swelling, redness at site where injected -seizures -stomach pain, ulcers -swelling of the ankles, feet, hands, or stomach -trouble passing urine or change in the amount of urine -vomiting -yellowing of the eyes or skin Side effects that usually do not require medical attention (report to your doctor or health care professional if they continue or are bothersome): -acne-like skin rash -darker skin color -diarrhea -hair loss -loss of appetite -nausea This list may not describe all possible side effects. Call your doctor for medical advice about side effects. You may report side effects to FDA at 1-800-FDA-1088. Where should I keep my medicine? This drug is given in a hospital or clinic and will not be stored at home. NOTE: This sheet is a  summary. It may not cover all possible information. If you have questions about this medicine, talk to your doctor, pharmacist, or health care provider.  2014, [employer]. (2008-01-22 17:09:59)  How should I use this medicine? This drug is given as an injection into a muscle or into a vein. It may also be given into the spinal fluid. It is administered in a hospital or clinic by a specially trained health care professional. Talk to your pediatrician regarding the use of this medicine in children. While this drug may be prescribed for selected conditions, precautions do apply. Overdosage: If you think you have taken too much of this medicine contact a poison control center or emergency room at once. NOTE: This medicine is only for you. Do not share this medicine with others. What if I miss a dose? It is important not to miss your dose. Call your doctor or health care professional if you are unable to keep an appointment. What may interact with this medicine? -antibiotics and other medicines for infections -aspirin and aspirin-like medicines including bismuth subsalicylate (Pepto-Bismol) -cisplatin -dapsone -folic acid in supplements or vitamins -mercaptopurine -NSAIDs, medicines for pain and inflammation, like ibuprofen or naproxen -  pemetrexed -phenylbutazone -phenytoin -probenecid -pyrimethamine -theophylline -trimetrexate -vaccines This list may not describe all possible interactions. Give your health care provider a list of all the medicines, herbs, non-prescription drugs, or dietary supplements you use. Also tell them if you smoke, drink alcohol, or use illegal drugs. Some items may interact with your medicine. What should I watch for while using this medicine? Visit your doctor for checks on your progress. You will need to have regular blood checks during your treatment to monitor your blood, liver function, and kidney function. This drug may make you feel generally  unwell. This is not uncommon, as chemotherapy can affect healthy cells as well as cancer cells. Report any side effects. Continue your course of treatment even though you feel ill unless your doctor tells you to stop. In some cases, you may be given additional medicines to help with side effects. Follow all directions for their use. Call your doctor or health care professional for advice if you get a fever, chills or sore throat, or other symptoms of a cold or flu. Do not treat yourself. This drug decreases your body's ability to fight infections. Try to avoid being around people who are sick. This medicine may increase your risk to bruise or bleed. Call your doctor or health care professional if you notice any unusual bleeding. Be careful brushing and flossing your teeth or using a toothpick because you may get an infection or bleed more easily. If you have any dental work done, tell your dentist you are receiving this medicine. Avoid taking products that contain aspirin, acetaminophen, ibuprofen, naproxen, or ketoprofen unless instructed by your doctor. These medicines may hide a fever. This medicine can make you more sensitive to the sun. Keep out of the sun. If you cannot avoid being in the sun, wear protective clothing and use sunscreen. Do not use sun lamps or tanning beds/booths. Do not treat diarrhea with over the counter products. Contact your doctor if you have diarrhea. To protect your kidneys, drink water or other fluids as directed while you are taking this medicine. Do not drink alcohol-containing drinks while taking this medicine. Both alcohol and the medicine may cause damage to your liver. Men and women must use effective birth control while they are taking this medicine. Do not become pregnant while taking this medicine. Women must continue using effective birth control for 1 full menstrual cycle after stopping this medicine. Tell your doctor right away if you think that you or your  partner might be pregnant. There is a potential for serious side effects to an unborn child. Talk to your health care professional or pharmacist for more information. Do not breast-feed an infant while taking this medicine. Men must continue effective birth control for 3 months after stopping this medicine. What side effects may I notice from receiving this medicine? Side effects that you should report to your doctor or health care professional as soon as possible: -allergic reactions like skin rash, itching or hives, swelling of the face, lips, or tongue -low blood counts - this medicine may decrease the number of white blood cells, red blood cells and platelets. You may be at increased risk for infections and bleeding. -signs of infection - fever or chills, cough, sore throat, pain or difficulty passing urine -signs of decreased platelets or bleeding - bruising, pinpoint red spots on the skin, black, tarry stools, blood in the urine -signs of decreased red blood cells - unusually weak or tired, fainting spells, lightheadedness -breathing problems, like a  dry cough -changes in vision -confusion, not alert -diarrhea -mouth or throat sores or ulcers -problems with balance, talking, walking -redness, blistering, peeling or loosening of the skin, including inside the mouth -seizures -trouble passing urine or change in the amount of urine -vomiting -yellowing of the eyes or skin Side effects that usually do not require medical attention (report to your doctor or health care professional if they continue or are bothersome): -change in skin color -eye irritation -hair loss -headache -loss of appetite -nausea -stomach upset This list may not describe all possible side effects. Call your doctor for medical advice about side effects. You may report side effects to FDA at 1-800-FDA-1088. Where should I keep my medicine? This drug is given in a hospital or clinic and will not be stored at  home. NOTE: This sheet is a summary. It may not cover all possible information. If you have questions about this medicine, talk to your doctor, pharmacist, or health care provider.  2014, [employer]vier/[employer] Standard. (2008-04-10 11:13:24)

## 2013-11-15 ENCOUNTER — Telehealth: Payer: Self-pay | Admitting: *Deleted

## 2013-11-15 ENCOUNTER — Ambulatory Visit (HOSPITAL_COMMUNITY)
Admission: RE | Admit: 2013-11-15 | Discharge: 2013-11-15 | Disposition: A | Discharge status: Home / Self Care | Payer: Managed Care, Other (non HMO) | Source: Ambulatory Visit | Attending: Gynecologic Oncology | Admitting: Gynecologic Oncology

## 2013-11-15 ENCOUNTER — Ambulatory Visit (HOSPITAL_COMMUNITY): Payer: Managed Care, Other (non HMO)

## 2013-11-15 ENCOUNTER — Encounter (HOSPITAL_COMMUNITY): Payer: Self-pay

## 2013-11-15 DIAGNOSIS — O019 Hydatidiform mole, unspecified: Secondary | ICD-10-CM

## 2013-11-15 DIAGNOSIS — C569 Malignant neoplasm of unspecified ovary: Secondary | ICD-10-CM | POA: Insufficient documentation

## 2013-11-15 MED ORDER — IOHEXOL 300 MG/ML  SOLN
100.0000 mL | Freq: Once | INTRAMUSCULAR | Status: AC | PRN
  Administered 2013-11-15: 100 mL via INTRAVENOUS

## 2013-11-15 NOTE — Telephone Encounter (Signed)
Per NP, called pt with results- chest xray normal, blood work/labs normal, CT scan area on lung suspicious- nodule could be inflammation from past infection or benign growth. Discussed with pt to expect a call from Dr. Paulla Dolly office regarding her setting her up for a  D&C. Pt verbalized understanding, no concerns at this time.

## 2013-11-17 ENCOUNTER — Telehealth: Payer: Self-pay | Admitting: Gynecologic Oncology

## 2013-11-17 NOTE — Telephone Encounter (Signed)
Message left that CXR and CT are without evidence of disease and that repeat [facility] is appropriate.

## 2013-11-18 ENCOUNTER — Encounter (HOSPITAL_COMMUNITY): Payer: Self-pay | Admitting: *Deleted

## 2013-11-18 ENCOUNTER — Encounter (HOSPITAL_COMMUNITY): Payer: Self-pay | Admitting: Pharmacist

## 2013-11-18 ENCOUNTER — Other Ambulatory Visit: Payer: Self-pay | Admitting: Obstetrics and Gynecology

## 2013-11-20 ENCOUNTER — Encounter (HOSPITAL_COMMUNITY)
Admission: RE | Disposition: A | Discharge status: Home / Self Care | Payer: Self-pay | Source: Ambulatory Visit | Attending: Obstetrics and Gynecology

## 2013-11-20 ENCOUNTER — Ambulatory Visit (HOSPITAL_COMMUNITY): Payer: Managed Care, Other (non HMO)

## 2013-11-20 ENCOUNTER — Ambulatory Visit (HOSPITAL_COMMUNITY): Payer: Managed Care, Other (non HMO) | Admitting: Anesthesiology

## 2013-11-20 ENCOUNTER — Encounter (HOSPITAL_COMMUNITY): Payer: Managed Care, Other (non HMO) | Admitting: Anesthesiology

## 2013-11-20 ENCOUNTER — Encounter (HOSPITAL_COMMUNITY): Payer: Self-pay | Admitting: Obstetrics and Gynecology

## 2013-11-20 ENCOUNTER — Ambulatory Visit (HOSPITAL_COMMUNITY)
Admission: RE | Admit: 2013-11-20 | Discharge: 2013-11-20 | Disposition: A | Discharge status: Home / Self Care | Payer: Managed Care, Other (non HMO) | Source: Ambulatory Visit | Attending: Obstetrics and Gynecology | Admitting: Obstetrics and Gynecology

## 2013-11-20 DIAGNOSIS — IMO0002 Reserved for concepts with insufficient information to code with codable children: Secondary | ICD-10-CM | POA: Diagnosis present

## 2013-11-20 DIAGNOSIS — O019 Hydatidiform mole, unspecified: Secondary | ICD-10-CM | POA: Diagnosis present

## 2013-11-20 DIAGNOSIS — Z6741 Type O blood, Rh negative: Secondary | ICD-10-CM | POA: Diagnosis present

## 2013-11-20 HISTORY — DX: Type O blood, Rh negative: Z67.41

## 2013-11-20 HISTORY — PX: DILATION AND EVACUATION: SHX1459

## 2013-11-20 LAB — HCG, QUANTITATIVE, PREGNANCY: hCG, Beta Chain, Quant, S: 88 m[IU]/mL — ABNORMAL HIGH (ref <5)

## 2013-11-20 SURGERY — DILATION AND EVACUATION, UTERUS
Anesthesia: Monitor Anesthesia Care | Site: Uterus

## 2013-11-20 MED ORDER — RHO D IMMUNE GLOBULIN 1500 UNIT/2ML IJ SOLN
300.0000 ug | Freq: Once | INTRAMUSCULAR | Status: AC
  Administered 2013-11-20: 300 ug via INTRAVENOUS
  Filled 2013-11-20: qty 2

## 2013-11-20 MED ORDER — PROPOFOL 10 MG/ML IV EMUL
INTRAVENOUS | Status: AC
  Filled 2013-11-20: qty 20

## 2013-11-20 MED ORDER — LIDOCAINE HCL (CARDIAC) 20 MG/ML IV SOLN
INTRAVENOUS | Status: AC
  Filled 2013-11-20: qty 5

## 2013-11-20 MED ORDER — KETOROLAC TROMETHAMINE 30 MG/ML IJ SOLN: 15.0000 mg | Freq: Once | INTRAMUSCULAR | Status: DC | PRN

## 2013-11-20 MED ORDER — 0.9 % SODIUM CHLORIDE (POUR BTL) OPTIME
TOPICAL | Status: DC | PRN
  Administered 2013-11-20: 1000 mL

## 2013-11-20 MED ORDER — LIDOCAINE HCL 2 % IJ SOLN
INTRAMUSCULAR | Status: DC | PRN
  Administered 2013-11-20: 11 mL

## 2013-11-20 MED ORDER — LIDOCAINE HCL (CARDIAC) 20 MG/ML IV SOLN
INTRAVENOUS | Status: DC | PRN
  Administered 2013-11-20: 50 mg via INTRAVENOUS

## 2013-11-20 MED ORDER — KETOROLAC TROMETHAMINE 30 MG/ML IJ SOLN
INTRAMUSCULAR | Status: AC
  Filled 2013-11-20: qty 1

## 2013-11-20 MED ORDER — MIDAZOLAM HCL 2 MG/2ML IJ SOLN
INTRAMUSCULAR | Status: DC | PRN
  Administered 2013-11-20: 2 mg via INTRAVENOUS

## 2013-11-20 MED ORDER — FENTANYL CITRATE 0.05 MG/ML IJ SOLN
INTRAMUSCULAR | Status: DC | PRN
  Administered 2013-11-20 (×3): 50 ug via INTRAVENOUS

## 2013-11-20 MED ORDER — PROPOFOL INFUSION 10 MG/ML OPTIME
INTRAVENOUS | Status: DC | PRN
  Administered 2013-11-20: 140 ug/kg/min via INTRAVENOUS

## 2013-11-20 MED ORDER — MIDAZOLAM HCL 2 MG/2ML IJ SOLN
INTRAMUSCULAR | Status: AC
  Filled 2013-11-20: qty 2

## 2013-11-20 MED ORDER — METOCLOPRAMIDE HCL 5 MG/ML IJ SOLN: 10.0000 mg | Freq: Once | INTRAMUSCULAR | Status: DC | PRN

## 2013-11-20 MED ORDER — LIDOCAINE HCL 2 % IJ SOLN
INTRAMUSCULAR | Status: AC
  Filled 2013-11-20: qty 20

## 2013-11-20 MED ORDER — MEPERIDINE HCL 25 MG/ML IJ SOLN: 6.2500 mg | INTRAMUSCULAR | Status: DC | PRN

## 2013-11-20 MED ORDER — FENTANYL CITRATE 0.05 MG/ML IJ SOLN: 25.0000 ug | INTRAMUSCULAR | Status: DC | PRN

## 2013-11-20 MED ORDER — KETOROLAC TROMETHAMINE 30 MG/ML IJ SOLN
INTRAMUSCULAR | Status: DC | PRN
  Administered 2013-11-20: 30 mg via INTRAMUSCULAR
  Administered 2013-11-20: 30 mg via INTRAVENOUS

## 2013-11-20 MED ORDER — ONDANSETRON HCL 4 MG/2ML IJ SOLN
INTRAMUSCULAR | Status: DC | PRN
  Administered 2013-11-20: 4 mg via INTRAVENOUS

## 2013-11-20 MED ORDER — DEXAMETHASONE SODIUM PHOSPHATE 4 MG/ML IJ SOLN
INTRAMUSCULAR | Status: DC | PRN
  Administered 2013-11-20: 4 mg via INTRAVENOUS

## 2013-11-20 MED ORDER — DEXAMETHASONE SODIUM PHOSPHATE 10 MG/ML IJ SOLN
INTRAMUSCULAR | Status: AC
  Filled 2013-11-20: qty 1

## 2013-11-20 MED ORDER — LACTATED RINGERS IV SOLN
INTRAVENOUS | Status: DC
  Administered 2013-11-20 (×3): via INTRAVENOUS

## 2013-11-20 MED ORDER — ONDANSETRON HCL 4 MG/2ML IJ SOLN
INTRAMUSCULAR | Status: AC
  Filled 2013-11-20: qty 2

## 2013-11-20 MED ORDER — FENTANYL CITRATE 0.05 MG/ML IJ SOLN
INTRAMUSCULAR | Status: AC
  Filled 2013-11-20: qty 5

## 2013-11-20 SURGICAL SUPPLY — 24 items
CATH ROBINSON RED A/P 16FR (CATHETERS) ×3 IMPLANT
CLOTH BEACON ORANGE TIMEOUT ST (SAFETY) ×3 IMPLANT
DECANTER SPIKE VIAL GLASS SM (MISCELLANEOUS) ×3 IMPLANT
DILATOR CANAL MILEX (MISCELLANEOUS) IMPLANT
DRSG TELFA 3X8 NADH (GAUZE/BANDAGES/DRESSINGS) ×3 IMPLANT
GLOVE BIO SURGEON STRL SZ8.5 (GLOVE) ×3 IMPLANT
GLOVE BIOGEL M 7.0 STRL (GLOVE) ×6 IMPLANT
GLOVE BIOGEL PI IND STRL 8.5 (GLOVE) ×1 IMPLANT
GLOVE BIOGEL PI INDICATOR 8.5 (GLOVE) ×2
GLOVE SURG SS PI 6.5 STRL IVOR (GLOVE) ×6 IMPLANT
GOWN STRL REUS W/TWL LRG LVL3 (GOWN DISPOSABLE) ×6 IMPLANT
KIT BERKELEY 1ST TRIMESTER 3/8 (MISCELLANEOUS) ×3 IMPLANT
NEEDLE SPNL 22GX3.5 QUINCKE BK (NEEDLE) ×3 IMPLANT
NS IRRIG 1000ML POUR BTL (IV SOLUTION) ×3 IMPLANT
PACK VAGINAL MINOR WOMEN LF (CUSTOM PROCEDURE TRAY) ×3 IMPLANT
PAD OB MATERNITY 4.3X12.25 (PERSONAL CARE ITEMS) ×3 IMPLANT
PAD PREP 24X48 CUFFED NSTRL (MISCELLANEOUS) ×3 IMPLANT
SET BERKELEY SUCTION TUBING (SUCTIONS) ×3 IMPLANT
SYR CONTROL 10ML LL (SYRINGE) ×3 IMPLANT
TOWEL OR 17X24 6PK STRL BLUE (TOWEL DISPOSABLE) ×6 IMPLANT
VACURETTE 10 RIGID CVD (CANNULA) IMPLANT
VACURETTE 7MM CVD STRL WRAP (CANNULA) ×3 IMPLANT
VACURETTE 8 RIGID CVD (CANNULA) IMPLANT
VACURETTE 9 RIGID CVD (CANNULA) IMPLANT

## 2013-11-20 NOTE — Op Note (Signed)
11/20/2013  3:18 PM  PATIENT:  Lynn Ibarra  43 y.o. female  PRE-OPERATIVE DIAGNOSIS:  Hydatidiform Mole  POST-OPERATIVE DIAGNOSIS:  Hydatidiform Mole  PROCEDURE:  Procedure(s): DILATATION AND EVACUATION Under U/S Guidance  SURGEON:  Surgeon(s): Eldred Manges, MD  ASSISTANTS: none   ANESTHESIA:   MAC  ESTIMATED BLOOD LOSS: Less than 10 cc  COMPLICATIONS: None  FINDINGS: on ultrasound,  The endometrial stripe appeared normal. There was a single right. Both at the fundus of the uterus, which resolved after D&C.  BLOOD ADMINISTERED:none  LOCAL MEDICATIONS USED:  XYLOCAINE  and Amount: 10 ml  SPECIMEN:  Source of Specimen:  Endometrial curettings and material from suction evacuate of the endometrium  DISPOSITION OF SPECIMEN:  PATHOLOGY  COUNTS:  YES  DESCRIPTION OF PROCEDURE:  The patient was taken the operating room after appropriate identification placed on the operating table. After the attainment of adequate anesthesia she was placed in the lithotomy position. Perineum and vagina were prepped with multiple areas of Betadine and a straight in and out catheter used to empty the bladder. The perineum was draped as a sterile field. A weighted speculum was placed in the posterior vagina and a paracervical block achieved a total of 10 cc of 2% Xylocaine and the 5 and 7:00 positions. A single-tooth tenaculum was placed on the anterior cervix and the cervix dilated to accommodate a number 7 suction catheter. The suction catheter was then used to suction evacuate all quadrants of the uterus. A sharp curet was used to ensure that all products of conception had been removed, and the suction procedure repeated.. All instruments were then removed from the vagina and the patient had her anesthetic reversed and was taken to the recovery room in satisfactory condition having tolerated the procedure well sponge and instrument counts correct.  PLAN OF CARE: Discharge home  PATIENT  DISPOSITION:  PACU - hemodynamically stable.   Delay start of Pharmacological VTE agent (>24hrs) due to surgical blood loss or risk of bleeding:  SCD hose were used during the procedure. Early ambulation is anticipated Blood type is O- and the patient will receive, Rh immunoglobulin prior to discharge.       Eldred Manges, MD 3:18 PM

## 2013-11-20 NOTE — Discharge Instructions (Signed)

## 2013-11-20 NOTE — Transfer of Care (Signed)
Immediate Anesthesia Transfer of Care Note  Patient: Lynn Ibarra  Procedure(s) Performed: Procedure(s): DILATATION AND EVACUATION Under U/S Guidance (N/A)  Patient Location: PACU  Anesthesia Type:MAC  Level of Consciousness: awake, alert , oriented and patient cooperative  Airway & Oxygen Therapy: Patient Spontanous Breathing and Patient connected to nasal cannula oxygen  Post-op Assessment: Report given to PACU RN and Post -op Vital signs reviewed and stable  Post vital signs: stable  Complications: No apparent anesthesia complications

## 2013-11-20 NOTE — Anesthesia Preprocedure Evaluation (Signed)
Anesthesia Evaluation  Patient identified by MRN, date of birth, ID band Patient awake    Reviewed: Allergy & Precautions, H&P , NPO status , Patient's Chart, lab work & pertinent test results, reviewed documented beta blocker date and time   History of Anesthesia Complications Negative for: history of anesthetic complications  Airway       Dental  (+) Teeth Intact   Pulmonary neg pulmonary ROS,  breath sounds clear to auscultation  Pulmonary exam normal       Cardiovascular negative cardio ROS  Rhythm:regular Rate:Normal     Neuro/Psych negative neurological ROS  negative psych ROS   GI/Hepatic negative GI ROS, Neg liver ROS,   Endo/Other  negative endocrine ROS  Renal/GU negative Renal ROS  negative genitourinary   Musculoskeletal   Abdominal   Peds  Hematology negative hematology ROS (+)   Anesthesia Other Findings   Reproductive/Obstetrics (+) Pregnancy (hydatidiform mole)                           Anesthesia Physical Anesthesia Plan  ASA: II  Anesthesia Plan: MAC   Post-op Pain Management:    Induction:   Airway Management Planned:   Additional Equipment:   Intra-op Plan:   Post-operative Plan:   Informed Consent: I have reviewed the patients History and Physical, chart, labs and discussed the procedure including the risks, benefits and alternatives for the proposed anesthesia with the patient or authorized representative who has indicated his/her understanding and acceptance.   Dental Advisory Given  Plan Discussed with: CRNA and Surgeon  Anesthesia Plan Comments:         Anesthesia Quick Evaluation

## 2013-11-20 NOTE — H&P (Signed)
Lynn Ibarra is an 43 y.o. 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 At the time of uterine evacuation the presence of chorionic villi was confirmed. 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  Subsequent beta hCGs are as follows  10/02/2014 301.4  10/11/2013 41.7  10/23/2013 25.94  10/30/2013 56.8  11/05/2013 74.8  11/13/2013 92.4 When 3 consecutive HCG increases were documented, Gyn Oncology consultation was obtained and after evaluation, options presented to [name] and her husband if no metastatic disease is identified and her WHO is remains =1 (low risk, non metastatic) were 1: repeat D&C 2: single agent chemotherapy, either weekly methotrexate with folic acid rescue or actinomycin D every two-weeks or 3: hysterectomy. They're not sure whether they are satisfied with parity and wish to proceed with a repeat D&C. They understand that if there is no further fall of the hCG then single agent chemotherapy will be recommended.  Pertinent Gynecological History: Menses: regular every month without intermenstrual spotting  DES exposure: denies  Blood transfusions: none  Sexually transmitted diseases: no past history  Previous GYN Procedures: none  Last mammogram: normal Date: 2014  Last pap: normal Date: 2013  OB History: G2, P1      Menstrual History: Menarche age: 66 Patient's last menstrual period was 07/27/2013.    Past Medical History  Diagnosis Date  . AMA (advanced maternal age) multigravida 81+   . H/O candidiasis   . Abnormal Pap smear 2010  . Hx: UTI (urinary tract infection)     Occasionally  . H/O bladder infections   . H/O varicella   . Yeast infection   . H/O constipation 11/14/11  . Pneumonia     childhood  . GERD (gastroesophageal reflux disease)    with pregnancy  . Anemia 11/14/11    with pregnancy now resolved  . Type o blood, rh negative     Past Surgical History  Procedure Laterality Date  . Colposcopy      x2  . Lasik  2001  . Eye surgery      lasic  . Dilation and evacuation N/A 09/20/2013    Procedure: DILATATION AND EVACUATION WITH FROZEN SECTION;  Surgeon: Eldred Manges, MD;  Location: [facility];  Service: Gynecology;  Laterality: N/A;    Family History  Problem Relation Age of Onset  . Peripheral vascular disease Mother   . Hypertension Father   . Peripheral vascular disease Father   . Kidney disease Father     nephrectomy  . Cancer Father     kidney  . Heart disease Paternal Aunt   . Depression Paternal Aunt   . Cancer Paternal Aunt     ovarian  . Hypertension Maternal Grandmother   . Diabetes Maternal Grandmother   . Seizures Maternal Grandmother     tia  . Stroke Maternal Grandmother   . Cancer Paternal Grandfather     prostate  . Stroke Maternal Grandfather     Social History:  reports that she has never smoked. She has never used smokeless tobacco. She reports that she drinks alcohol. She reports that she does not use illicit drugs.  Allergies: No Known Allergies  Prescriptions prior to admission  Medication Sig Dispense Refill  . docusate sodium (COLACE) 100 MG capsule Take 200 mg by mouth daily as needed for mild  constipation.      Marland Kitchen ibuprofen (ADVIL,MOTRIN) 600 MG tablet Take 600 mg by mouth every 6 (six) hours as needed for mild pain or moderate pain.        ROS Constitutional  Feels well,  Cardiovascular  No chest pain, shortness of breath, or edema  Pulmonary  No cough or hemoptysis  Gastro Intestinal  No nausea, vomitting, or diarrhoea. No bright red blood per rectum, no abdominal pain, change in bowel movement, or constipation.  Genito Urinary  No vaginal bleeding.  Has had scant bloody discharge for 2 days   Blood pressure 112/71, pulse 72, temperature 97.5 F (36.4 C),  temperature source Oral, resp. rate 20, last menstrual period 07/27/2013, SpO2 96.00%. Physical Exam  Constitutional: She is oriented to person, place, and time. She appears well-developed and well-nourished.  HENT:  Head: Normocephalic and atraumatic.  Eyes: Conjunctivae and EOM are normal.  Neck: Normal range of motion. Neck supple. No thyromegaly present.  Cardiovascular: Normal rate and regular rhythm.  Respiratory: Effort normal and breath sounds normal.  GI: Soft. Bowel sounds are normal.  Musculoskeletal: Normal range of motion.  Neurological: She is alert and oriented to person, place, and time.  Skin: Skin is warm and dry.  Psychiatric:  Appropriate affect for recent loss and current diagnosis Pelvic per Gyn Onc on 11/14/13  Vulva/vagina: Normal external female genitalia. No lesions.  Bladder/urethra: No lesions or masses  Vagina: no lesions, no vaginal bleeding  Cervix: Normal appearing, no lesions.  Uterus: Small, mobile, no parametrial involvement or nodularity.  Adnexa: No palpable masses.  Rectal  Good tone, no masses no cul de sac nodularity.   STUDIES FROM 11/15/12 CXR FINDINGS:  The heart size and mediastinal contours are within normal limits.  Both lungs are clear. The visualized skeletal structures are  unremarkable.  IMPRESSION:  No active cardiopulmonary disease.  CT SCAN ABDOMEN AND PELVIS IMPRESSION:  No evidence of metastatic disease within the abdomen or pelvis.  4 mm indeterminate pulmonary nodule in the inferior aspect of the  right middle lobe. This is unlikely to represent metastatic disease  given the lack of metastatic disease seen within the abdomen or  pelvis. Correlation with HCG level is suggested, and recommend  followup by chest CT in 3 months.  Assessment/Plan: Gestational Trophoblastic Neoplasia Based on the negative metastatic workup, Gyn Oncology's option of repeat D&C is chosen by patient and her husband.  They are aware of the  risks of the procedure which include anesthesia, bleeding, infection and damage to adjacent organs including uterine perforation.  They understand based on their conversation with Gyn Oncology, that if there is no further fall of the hCG then single agent chemotherapy will be recommended.   Mareta Chesnut P 11/20/2013, 1:40 PM

## 2013-11-21 LAB — RH IG WORKUP (INCLUDES ABO/RH)
ABO/RH(D): O NEG
Antibody Screen: POSITIVE
DAT, IgG: NEGATIVE
Gestational Age(Wks): 8
UNIT DIVISION: 0

## 2013-11-21 NOTE — Anesthesia Postprocedure Evaluation (Signed)
  Anesthesia Post-op Note  Anesthesia Post Note  Patient: Lynn Ibarra  Procedure(s) Performed: Procedure(s) (LRB): DILATATION AND EVACUATION Under U/S Guidance (N/A)  Anesthesia type: MAC  Patient location: PACU  Post pain: Pain level controlled  Post assessment: Post-op Vital signs reviewed  Last Vitals:  Filed Vitals:   11/20/13 1615  BP: 109/77  Pulse: 78  Temp: 36.7 C  Resp: 25    Post vital signs: Reviewed  Level of consciousness: sedated  Complications: No apparent anesthesia complications

## 2013-11-22 ENCOUNTER — Encounter (HOSPITAL_COMMUNITY): Payer: Self-pay | Admitting: Obstetrics and Gynecology

## 2013-11-30 ENCOUNTER — Telehealth: Payer: Self-pay | Admitting: Gynecologic Oncology

## 2013-11-30 NOTE — Telephone Encounter (Signed)
Message left that I am unable to view the [facility] post repeat D&C.  Request made for patient to call with the values.

## 2013-12-16 ENCOUNTER — Telehealth: Payer: Self-pay | Admitting: *Deleted

## 2013-12-16 NOTE — Telephone Encounter (Signed)
Called pt, received message "[employer] customer unable to be reached."  Unable to leave message for pt. Pt to have labs drawn with Dr. Paulla Dolly office this week, MD aware. Message for pt- F/U appt with Dr. Skeet Latch 3/19 11:15am.

## 2013-12-16 NOTE — Telephone Encounter (Signed)
Called pt confirmed pt appt on 3/19 at 11;15. Pt verbalized understanding.

## 2013-12-25 ENCOUNTER — Other Ambulatory Visit: Payer: Self-pay | Admitting: Gynecologic Oncology

## 2013-12-25 ENCOUNTER — Telehealth: Payer: Self-pay | Admitting: Oncology

## 2013-12-25 DIAGNOSIS — O019 Hydatidiform mole, unspecified: Secondary | ICD-10-CM

## 2013-12-25 NOTE — Telephone Encounter (Signed)
C/D 12/25/13 for appt. 12/27/13

## 2013-12-25 NOTE — Telephone Encounter (Signed)
S/W PATIENT AND GAVE NEW PATIENT APPT FOR 03/13 @ 8:30 W/DR. LIVESAY; CHEMO EDU 03/12 @ 10.

## 2013-12-26 ENCOUNTER — Other Ambulatory Visit: Payer: Self-pay | Admitting: Oncology

## 2013-12-26 ENCOUNTER — Encounter: Payer: Self-pay | Admitting: *Deleted

## 2013-12-26 ENCOUNTER — Other Ambulatory Visit: Payer: Managed Care, Other (non HMO)

## 2013-12-26 DIAGNOSIS — O019 Hydatidiform mole, unspecified: Secondary | ICD-10-CM

## 2013-12-27 ENCOUNTER — Other Ambulatory Visit (HOSPITAL_BASED_OUTPATIENT_CLINIC_OR_DEPARTMENT_OTHER): Payer: Managed Care, Other (non HMO)

## 2013-12-27 ENCOUNTER — Encounter (INDEPENDENT_AMBULATORY_CARE_PROVIDER_SITE_OTHER): Payer: Self-pay

## 2013-12-27 ENCOUNTER — Encounter: Payer: Self-pay | Admitting: Oncology

## 2013-12-27 ENCOUNTER — Telehealth: Payer: Self-pay | Admitting: *Deleted

## 2013-12-27 ENCOUNTER — Telehealth: Payer: Self-pay | Admitting: Oncology

## 2013-12-27 ENCOUNTER — Ambulatory Visit (HOSPITAL_BASED_OUTPATIENT_CLINIC_OR_DEPARTMENT_OTHER): Payer: Managed Care, Other (non HMO) | Admitting: Oncology

## 2013-12-27 ENCOUNTER — Ambulatory Visit (HOSPITAL_BASED_OUTPATIENT_CLINIC_OR_DEPARTMENT_OTHER): Payer: Managed Care, Other (non HMO)

## 2013-12-27 VITALS — BP 111/73 | HR 70 | Temp 97.9°F | Resp 18 | Ht 65.0 in | Wt 137.3 lb

## 2013-12-27 DIAGNOSIS — O019 Hydatidiform mole, unspecified: Secondary | ICD-10-CM

## 2013-12-27 LAB — COMPREHENSIVE METABOLIC PANEL (CC13)
ALT: 16 U/L (ref 0–55)
ANION GAP: 9 meq/L (ref 3–11)
AST: 17 U/L (ref 5–34)
Albumin: 3.8 g/dL (ref 3.5–5.0)
Alkaline Phosphatase: 47 U/L (ref 40–150)
BUN: 13.7 mg/dL (ref 7.0–26.0)
CALCIUM: 9.3 mg/dL (ref 8.4–10.4)
CHLORIDE: 107 meq/L (ref 98–109)
CO2: 21 mEq/L — ABNORMAL LOW (ref 22–29)
Creatinine: 1 mg/dL (ref 0.6–1.1)
GLUCOSE: 124 mg/dL (ref 70–140)
POTASSIUM: 4 meq/L (ref 3.5–5.1)
Sodium: 138 mEq/L (ref 136–145)
Total Bilirubin: 0.42 mg/dL (ref 0.20–1.20)
Total Protein: 7.3 g/dL (ref 6.4–8.3)

## 2013-12-27 LAB — HCG, QUANTITATIVE, PREGNANCY: hCG, Beta Chain, Quant, S: 72.8 m[IU]/mL

## 2013-12-27 LAB — CBC WITH DIFFERENTIAL/PLATELET
BASO%: 0.3 % (ref 0.0–2.0)
BASOS ABS: 0 10*3/uL (ref 0.0–0.1)
EOS ABS: 0.1 10*3/uL (ref 0.0–0.5)
EOS%: 0.8 % (ref 0.0–7.0)
HCT: 40.6 % (ref 34.8–46.6)
HGB: 13.9 g/dL (ref 11.6–15.9)
LYMPH#: 1.7 10*3/uL (ref 0.9–3.3)
LYMPH%: 25.8 % (ref 14.0–49.7)
MCH: 32.1 pg (ref 25.1–34.0)
MCHC: 34.2 g/dL (ref 31.5–36.0)
MCV: 93.8 fL (ref 79.5–101.0)
MONO#: 0.4 10*3/uL (ref 0.1–0.9)
MONO%: 6 % (ref 0.0–14.0)
NEUT#: 4.5 10*3/uL (ref 1.5–6.5)
NEUT%: 67.1 % (ref 38.4–76.8)
Platelets: 180 10*3/uL (ref 145–400)
RBC: 4.33 10*6/uL (ref 3.70–5.45)
RDW: 12.1 % (ref 11.2–14.5)
WBC: 6.7 10*3/uL (ref 3.9–10.3)

## 2013-12-27 LAB — TSH CHCC: TSH: 1.921 m[IU]/L (ref 0.308–3.960)

## 2013-12-27 NOTE — Patient Instructions (Signed)
We will send prescriptions for nausea medicines to [facility] [address] [place]:  Zofran (ondansetron) 8 mg   1-2 every 12 hours as needed for nausea after chemotherapy. Will not make you drowsy. Suggest taking one tablet the morning after chemo whether or not any nausea, to cover a bit longer. Call if headache after this medicine.  Ativan (lorazepam) 0.5 mg   1 every 4-6 hours as needed for nausea after chemotherapy. This will make you drowsy and sometimes a little forgetful just around the time of the medicine. It can dissolve under tongue or swallow. Suggest taking one tablet the night of first chemo, whether or not any nausea then.  We will set up PICC line by interventional radiology ~ day before starting treatment.  You can call at any time if questions or concerns   [phone]

## 2013-12-27 NOTE — Progress Notes (Signed)
Checked in new patient with no financial issue. She has not been out of the country

## 2013-12-27 NOTE — Telephone Encounter (Signed)
appts made and printed. Pt is aware that tx will be added. i emailed MW to add the tx. Pt is aware that cs will call w/ appt for IR.....td

## 2013-12-27 NOTE — Progress Notes (Signed)
Lynn Ibarra NEW PATIENT EVALUATION   Name: Lynn Ibarra Date: 12/27/2013 MRN: [id number] DOB: Dec 17, 1970  REFERRING PHYSICIAN: Janie Ibarra CC: Lynn Ibarra, Lynn Ibarra   REASON FOR REFERRAL: gestational trophoblastic neoplasm   HISTORY OF PRESENT ILLNESS:Lynn Ibarra is a 43 y.o. female who is seen in consultation, alone for visit, at the request of Dr Lynn Ibarra, for consideration of chemotherapy for GTN.       Patient had LMP 07-27-13, but appeared to have failed IUP by early Dec. She had dilatation and evacuation by Dr Lynn Ibarra on 09-20-13, with pathology [id number]) showing complete hydatidiform mole. Beta HCG, which was 52,590 on 09-17-14, progressively dropped over next several weeks to low of 25.9 on 10-23-13, then began rising and was 92 when she was seen in consultation by Dr Lynn Ibarra on 11-14-13. 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 She had repeat D&E under US guidance by Dr Lynn Ibarra on 11-20-13, with pathology [id number]) benign secretory endometrium. Beta HCG 61 - 80 since most recent D&E. Dr Lynn Ibarra discussed options of hysterectomy vs single agent chemotherapy with MTX or actinomycin D; patient tells me that, if chemotherapy is likely to eradicate the GTN, she prefers chemotherapy to hysterectomy. Dr Lynn Ibarra prefers actinomycin D as initial treatment; patient had full chemotherapy education day prior to this visit.   Beta HCG: 08-26-2013   261 09-17-2013   52,590 10-02-2013   301 10-11-2013   41.7 10-23-2013   25.9 10-30-2013   56.8 11-05-2013   74.8 11-13-2013   92.4 11-20-2013   88 11-11-2013   61.8 12-22-2013   80.2 12-26-2013   67  She is on OCP by Dr Lynn Ibarra, tolerating without difficulty. She is feeling entirely well, with no bleeding and no cramping or other abdominal or pelvic discomfort.  Patient is to see Dr Lynn Ibarra next on 01-02-14.    REVIEW OF SYSTEMS as above, also: Present weight up ~ 5 lbs from usual. No HA. Good energy. Good visual acuity without corrective lenses. Mild seasonal allergies, uses prn zyrtec etc. Good auditory acuity. Some gum disease, not bothersome, up to date on dental exams and cleaning. No thyroid problems. No respiratory or cardiac symptoms. No breast changes. Occasional diet-related GERD. Occasional mild constipation, uses prn colace only every couple of weeks. Remote bladder infections. No arthritis symptoms. No blood clots.  Remainder of full 10 point review of systems negative.   ALLERGIES: Review of patient's allergies indicates no known allergies.  PAST MEDICAL/ SURGICAL HISTORY:    G2P1 Mammogram [facility] 2014 reportedly normal other than dense breast tissue. PAP normal 2013 Lasik 2001  CURRENT MEDICATIONS: reviewed as listed now in EMR. Has not used antiemetics previously. Prefers least medication necessary.  PHARMACY [facility] [address]   SOCIAL HISTORY:  From [place], in [place] x 10 years originally with work. Married to Dr Lynn Ibarra Lynn Ibarra ED). No tobacco, occasional social alcohol. 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 68 yo son who stays at home, nanny assists with child. She works out with trainer at gym generally a few times weekly.  FAMILY HISTORY:  Mother HTN, varicose veins Father renal cancer treated surgically, HTN Paternal aunt ovarian cancer in 58s Paternal grandfather prostate cancer Maternal grandmother DM, CVA One brother without known medical problems Son healthy          PHYSICAL EXAM:  height is 5\' 5"  (1.651 m) and weight is  137 lb 4.8 oz (62.279 kg). Her oral temperature is 97.9 F (36.6 C). Her blood pressure is 111/73 and her pulse is 70. Her respiration is 18.  Alert, pleasant, cooperative, excellent historian, looks comfortable  HEENT: normal hair pattern. PERRL, not icteric. Oral mucosa moist and clear, posterior pharynx also, no dental  problems apparent. Neck supple, no JVD, no thyroid mass  RESPIRATORY: lungs clear to auscultation and percussion.   CARDIAC/ VASCULAR: Heart RRR, no gallop. Clear heart sounds.  ABDOMEN: soft, nontender, normal bowel sounds, no HSM or mass.  LYMPH NODES: No cervical, supraclavicular, axillary, inguinal adenopathy  NEUROLOGIC: CN, motor, sensory, cerebellar nonfocal. Psych normal mood and affect  SKIN: no rash, ecchymosis, petechiae  MUSCULOSKELETAL: extremities without pitting edema, cords, tenderness. Back not tender.    LABORATORY DATA:  Results for orders placed in visit on 12/27/13 (from the past 48 hour(s))  CBC WITH DIFFERENTIAL     Status: None   Collection Time    12/27/13  8:46 AM      Result Value Ref Range   WBC 6.7  3.9 - 10.3 10e3/uL   NEUT# 4.5  1.5 - 6.5 10e3/uL   HGB 13.9  11.6 - 15.9 g/dL   HCT 40.6  34.8 - 46.6 %   Platelets 180  145 - 400 10e3/uL   MCV 93.8  79.5 - 101.0 fL   MCH 32.1  25.1 - 34.0 pg   MCHC 34.2  31.5 - 36.0 g/dL   RBC 4.33  3.70 - 5.45 10e6/uL   RDW 12.1  11.2 - 14.5 %   lymph# 1.7  0.9 - 3.3 10e3/uL   MONO# 0.4  0.1 - 0.9 10e3/uL   Eosinophils Absolute 0.1  0.0 - 0.5 10e3/uL   Basophils Absolute 0.0  0.0 - 0.1 10e3/uL   NEUT% 67.1  38.4 - 76.8 %   LYMPH% 25.8  14.0 - 49.7 %   MONO% 6.0  0.0 - 14.0 %   EOS% 0.8  0.0 - 7.0 %   BASO% 0.3  0.0 - 2.0 %  COMPREHENSIVE METABOLIC PANEL (WU98)     Status: Abnormal   Collection Time    12/27/13  8:46 AM      Result Value Ref Range   Sodium 138  136 - 145 mEq/L   Potassium 4.0  3.5 - 5.1 mEq/L   Chloride 107  98 - 109 mEq/L   CO2 21 (*) 22 - 29 mEq/L   Glucose 124  70 - 140 mg/dl   BUN 13.7  7.0 - 26.0 mg/dL   Creatinine 1.0  0.6 - 1.1 mg/dL   Total Bilirubin 0.42  0.20 - 1.20 mg/dL   Alkaline Phosphatase 47  40 - 150 U/L   AST 17  5 - 34 U/L   ALT 16  0 - 55 U/L   Total Protein 7.3  6.4 - 8.3 g/dL   Albumin 3.8  3.5 - 5.0 g/dL   Calcium 9.3  8.4 - 10.4 mg/dL   Anion Gap 9  3 -  11 mEq/L  HCG, QUANTITATIVE, PREGNANCY     Status: None   Collection Time    12/27/13  8:46 AM      Result Value Ref Range   hCG, Beta Chain, Quant, S 72.8     Comment:   Males and non-pregnant females       < 5   mIU/mL Gestation Age  Concentration (mIU/mL)   <= 1 Week                       5 - 50      2 Weeks                     50 - 500      3 Weeks                    100 - 10,000      4 Weeks                       1,000 - 30,000      5 Weeks                  3,500 - 115,000    6-8 Weeks                 12,000 - 270,000     12 Weeks                 15,000 - 220,000  TSH [facility]     Status: None   Collection Time    12/27/13  8:46 AM      Result Value Ref Range   TSH 1.921  0.308 - 3.960 m(IU)/L    Results of beta HCG available after visit will be communicated to patient and to Dr Lynn Ibarra (note value from 12-26-13 had been a little lower at 66).  PATHOLOGY:  Accession: [id number] Received: 09/20/2013  ADDITIONAL INFORMATION: Immunostain for p57 was performed at [place]. p57 is negative in villous trophoblast nuclei with appropriate control. 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 FINAL DIAGNOSIS Diagnosis Products of Conception - HYDATIDIFORM MOLE    Accession: [id number] Received: 02/04/2015FINAL DIAGNOSIS Diagnosis Endometrium, curettage - BENIGN SECRETORY ENDOMETRIUM, NO ATYPIA, HYPERPLASIA OR MALIGNANCY.  RADIOGRAPHY: CHEST 2 VIEW 11-15-2013 COMPARISON: None.  FINDINGS:  The heart size and mediastinal contours are within normal limits.  Both lungs are clear. The visualized skeletal structures are  unremarkable.  IMPRESSION:  No active cardiopulmonary disease.   CT ABDOMEN AND PELVIS WITH CONTRAST  11-15-2013 COMPARISON: None.  FINDINGS:  Images through the lung bases show a 4 mm indeterminate pulmonary  nodule in the inferior aspect of the right middle lobe on image 2.  No other nodules visualized in the visualized portions of the  lung  bases.  Uterus and adnexal regions are unremarkable in appearance. No  adnexal masses are identified. No evidence of pelvic lymphadenopathy  or ascites.  No evidence of retroperitoneal or other abdominal lymphadenopathy.  Several tiny sub-cm hepatic cysts are noted, but no liver masses are  identified. The gallbladder, pancreas, spleen, adrenal glands, and  kidneys are normal in appearance. No evidence of hydronephrosis.  No evidence of inflammatory process or abnormal fluid collections.  No evidence of bowel wall thickening or dilatation. No suspicious  bone lesions identified.  IMPRESSION:  No evidence of metastatic disease within the abdomen or pelvis.  4 mm indeterminate pulmonary nodule in the inferior aspect of the  right middle lobe. This is unlikely to represent metastatic disease  given the lack of metastatic disease seen within the abdomen or  pelvis. Correlation with HCG level is suggested, and recommend  followup by chest CT in 3 months.    MEDICATIONS: reviewed as listed in EMR. Discussed prn zofran and ativan for nausea, prescriptions to be  sent to her pharmacy. I have given written and oral instructions to use ativan night of first chemo, whether or not any nausea, then zofran AM after first chemo whether or not any nausea, as well as prn according to instructions. I have assured her that she can omit these scheduled doses after first treatment if not needed. We have discussed prn colace if constipation with chemo, tho diarrhea is also possible with this regimen.     DISCUSSION:  I discussed with Dr Lynn Ibarra by phone on 12-25-13 and communicated with her prior to visit also regarding beta HCG of 12-26-13 and chemo regimen. I discussed directly with [facility] pharmacist prior to visit, as EMR careplan for actinomycin D will need to be adjusted for every other week treatment at 1.25 mg/m2. Drug is available in pharmacy.  I have discussed course as in history above with  patient now, and reviewed options for hysterectomy vs single agent chemotherapy. We have discussed antiemetics and length of treatment, which depends on how quickly beta HCG normalizes and if she receives possibly additional 2 consolidation treatments beyond normalization. As actinomycin D is a vesicant, my preference would be to have PICC for treatment, which could be placed by IR shortly prior to start of treatment; Dr Zenia Resides likely could do some of the twice weekly flushes at home if they prefer. Tho patient knows that she has good peripheral venous access, she understands concerns about the vesicant drug and is in agreement with PICC. She has had further teaching re PICC care by RN today. She understands that she can contact this office at any time if questions or concerns. She and/or Dr Zenia Resides are also welcome to talk with me further if anything further needs to be addressed after visit today.  Note she is to see Dr Lynn Ibarra again on 01-02-14. She is going out of town weekend of 3-20 and prefers treatments on Fridays, afternoons if possible, so we will plan to begin treatment on 3-27-1    IMPRESSION / PLAN:  1.Gestational trophoblastic neoplasm: FIGO I, WHO low risk in otherwise healthy 43 yo lady who prefers chemotherapy to hysterectomy. Plan as above for actinomycin D every other week probably for 2 cycles beyond normalization of marker, tho I will confirm this with gyn oncology. First treatment 01-10-14 due to other appointments and scheduling conflicts, as long as this is acceptable to Dr Lynn Ibarra. Will follow weekly beta HCG at this office rather than separate visits to [facility] now. 2.PICC needed for actinomycin D: have requested by IR on ~ 01-09-14 3.on OCP, which will need to continue until a year after normalization of beta HCG 4.post Lasik      Patient has had questions answered to her satisfaction and is in agreement with plan above.   Time spent 45 min, including >50% discussion  and coordination of care.    Gordy Levan, MD 12/27/2013 8:24 PM

## 2013-12-27 NOTE — Telephone Encounter (Signed)
Medical Oncology  Spoke with patient by phone to give beta HCG result from repeat lab today.  Godfrey Pick, MD

## 2013-12-27 NOTE — Progress Notes (Signed)
Lynn Ibarra has no treatment plan As of 12/26/13

## 2013-12-30 ENCOUNTER — Other Ambulatory Visit: Payer: Self-pay

## 2013-12-30 ENCOUNTER — Telehealth: Payer: Self-pay | Admitting: Oncology

## 2013-12-30 ENCOUNTER — Other Ambulatory Visit: Payer: Self-pay | Admitting: Oncology

## 2013-12-30 DIAGNOSIS — O019 Hydatidiform mole, unspecified: Secondary | ICD-10-CM

## 2013-12-30 MED ORDER — ONDANSETRON HCL 8 MG PO TABS: ORAL_TABLET | ORAL | Status: DC

## 2013-12-30 MED ORDER — LORAZEPAM 0.5 MG PO TABS: ORAL_TABLET | ORAL | Status: DC

## 2013-12-30 NOTE — Telephone Encounter (Signed)
Medical Oncology  Phone call from Dr Skeet Latch, who has spoken with [facility] x2. Patient does not want PICC, so prefers MTX to actinomycin D.  Dr Skeet Latch would prefer treatment begin before 01-10-14; note patient is going out of town weekend of 3-21. Gyn onc does treat for 2 cycles beyond normalization of marker. Note to RN as patient will need teaching re IM MTX weekly and to see if patient will agree to start of treatment this week.  L.Kimika Streater, MD.

## 2013-12-30 NOTE — Progress Notes (Signed)
Medications sent to pharmacy as ordered by Dr. Marko Plume.  Prescriptions did not E-prescribe from the treatment care plan.

## 2013-12-30 NOTE — Progress Notes (Signed)
She needs scripts for zofran and ativan, generics fine. I tried to do these on the chemo careplan, but do not know if they went thru.  Zofran 8 mg 1-2 every 12 hrs prn nausea. Will not make sleepy #30 1RF  Ativan 0.5 mg 1 SL or po q 4-6 hr prn nausea. WIll make drowsy #20  If you do the script, otherwise whatever

## 2013-12-31 ENCOUNTER — Telehealth: Payer: Self-pay | Admitting: *Deleted

## 2013-12-31 ENCOUNTER — Other Ambulatory Visit: Payer: Self-pay | Admitting: Oncology

## 2013-12-31 DIAGNOSIS — O019 Hydatidiform mole, unspecified: Secondary | ICD-10-CM

## 2013-12-31 NOTE — Telephone Encounter (Signed)
Per staff message and POF I have scheduled appts.  JMW  

## 2013-12-31 NOTE — Telephone Encounter (Signed)
Spoke with patient regarding starting methotrexate this week. Pt agreeable to start on 3/19 after appt with Dr Skeet Latch. Provided chemo education to patient on mtx.

## 2014-01-01 ENCOUNTER — Telehealth: Payer: Self-pay | Admitting: *Deleted

## 2014-01-01 NOTE — Telephone Encounter (Signed)
Lm informed that pt that her PICC  appt is cancel. gv appt for 01/10/14 @ 12 noon. Pt is aware that i will mail a letter/cal...td

## 2014-01-01 NOTE — Telephone Encounter (Signed)
Per staff message I have adjusted 3/27 appt

## 2014-01-02 ENCOUNTER — Ambulatory Visit: Payer: Managed Care, Other (non HMO) | Attending: Gynecologic Oncology | Admitting: Gynecologic Oncology

## 2014-01-02 ENCOUNTER — Other Ambulatory Visit (HOSPITAL_BASED_OUTPATIENT_CLINIC_OR_DEPARTMENT_OTHER): Payer: Managed Care, Other (non HMO)

## 2014-01-02 ENCOUNTER — Ambulatory Visit (HOSPITAL_BASED_OUTPATIENT_CLINIC_OR_DEPARTMENT_OTHER): Payer: Managed Care, Other (non HMO)

## 2014-01-02 ENCOUNTER — Telehealth: Payer: Self-pay | Admitting: Gynecologic Oncology

## 2014-01-02 VITALS — BP 113/65 | HR 59 | Temp 96.9°F

## 2014-01-02 VITALS — BP 121/79 | HR 68 | Temp 97.8°F | Resp 16

## 2014-01-02 DIAGNOSIS — O019 Hydatidiform mole, unspecified: Secondary | ICD-10-CM | POA: Insufficient documentation

## 2014-01-02 DIAGNOSIS — Z87440 Personal history of urinary (tract) infections: Secondary | ICD-10-CM | POA: Insufficient documentation

## 2014-01-02 DIAGNOSIS — K219 Gastro-esophageal reflux disease without esophagitis: Secondary | ICD-10-CM | POA: Insufficient documentation

## 2014-01-02 DIAGNOSIS — R911 Solitary pulmonary nodule: Secondary | ICD-10-CM | POA: Insufficient documentation

## 2014-01-02 DIAGNOSIS — Z5111 Encounter for antineoplastic chemotherapy: Secondary | ICD-10-CM

## 2014-01-02 DIAGNOSIS — D392 Neoplasm of uncertain behavior of placenta: Secondary | ICD-10-CM

## 2014-01-02 LAB — CBC WITH DIFFERENTIAL/PLATELET
BASO%: 0.3 % (ref 0.0–2.0)
Basophils Absolute: 0 10*3/uL (ref 0.0–0.1)
EOS%: 0.6 % (ref 0.0–7.0)
Eosinophils Absolute: 0.1 10*3/uL (ref 0.0–0.5)
HCT: 39.5 % (ref 34.8–46.6)
HGB: 13.5 g/dL (ref 11.6–15.9)
LYMPH%: 28 % (ref 14.0–49.7)
MCH: 31.5 pg (ref 25.1–34.0)
MCHC: 34.2 g/dL (ref 31.5–36.0)
MCV: 92.3 fL (ref 79.5–101.0)
MONO#: 0.5 10*3/uL (ref 0.1–0.9)
MONO%: 6.7 % (ref 0.0–14.0)
NEUT#: 5 10*3/uL (ref 1.5–6.5)
NEUT%: 64.4 % (ref 38.4–76.8)
Platelets: 189 10*3/uL (ref 145–400)
RBC: 4.28 10*6/uL (ref 3.70–5.45)
RDW: 12.4 % (ref 11.2–14.5)
WBC: 7.8 10*3/uL (ref 3.9–10.3)
lymph#: 2.2 10*3/uL (ref 0.9–3.3)

## 2014-01-02 LAB — HCG, QUANTITATIVE, PREGNANCY: hCG, Beta Chain, Quant, S: 7 m[IU]/mL

## 2014-01-02 MED ORDER — PROCHLORPERAZINE MALEATE 10 MG PO TABS
ORAL_TABLET | ORAL | Status: AC
  Filled 2014-01-02: qty 1

## 2014-01-02 MED ORDER — PROCHLORPERAZINE MALEATE 10 MG PO TABS
10.0000 mg | ORAL_TABLET | Freq: Once | ORAL | Status: AC
Start: 2014-01-02 — End: 2014-01-02
  Administered 2014-01-02: 10 mg via ORAL

## 2014-01-02 MED ORDER — METHOTREXATE SODIUM CHEMO INJECTION 25 MG/ML
50.0000 mg/m2 | Freq: Once | INTRAMUSCULAR | Status: AC
  Administered 2014-01-02: 85 mg via INTRAMUSCULAR
  Filled 2014-01-02: qty 3.4

## 2014-01-02 NOTE — Progress Notes (Signed)
Office Note: Gyn-Onc  Lynn Ibarra 43 y.o. 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 Significant findings are that her age is 21 years, the antecedent pregnancy was a live born infant at term 2 years ago, the highest pretreatment hCG is 52,590, the uterine size at the time of D&C was likely 8-10 cm given the fact that a #8 suction catheter was used.   A chest x-ray and CT of the abdomen and pelvis was notable for a 30mm pulmonary nodule.  The options presented to Ms. Mey  single agent chemotherapy, either weekly methotrexate with folic acid rescue or actinomycin D every two-weeks or 3: hysterectomy. The plan is for weekly MTX until two cycles past normal Cycle 1 MTX today  Beta HCG weekly  CT chest 03/2014  Advised to continue use of OCP until at least six months after normalization of HCG.  History of Present Illness:  Lynn Ibarra is a 43 y.o. gravida 2 para 1 last normal menstrual period 07/27/2013. On 09/27/2013 she underwent dilation and evacuation for presumed missed AB. The highest beta hCG prior to that procedure was 52,590 on 09/17/2013 . At the time of uterine evacuate evacuation the presence of chorionic villi was confirmed. 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  Subsequent beta hCGs are as follows  10/02/2014 301.4  10/11/2013 41.7  10/23/2013 25.94  10/30/2013 56.8  11/05/2013 74.8  11/13/2013 92.4  Ms. Bart's blood type is O- the last pregnancy was 2 years ago that resulted in a live born infant at term, she denies cough, shortness of breath, hemoptysis. She denies abdominal pain or vaginal bleeding. She is undecided regarding satisfaction of her parity. She received RhoGAM after the D&C.  Imaging studies without evidence of mets.  WHO GTD score =1 (low risk, non metastatic)  She underwent repeat D&C. 11/20/13  11-20-2013 88  11-11-2013 61.8   12-22-2013 80.2  12-26-2013 69  12-27-2013 72.8  Discussion held with Dr and Mrs. Lunden regarding intervention Actinomycin D vs MTX vs hys. The plan is for weekly treatment with MTX for two cycles past a normal HCG. Cycle #1 today   Past Surgical Hx:  Past Surgical History   Procedure  Laterality  Date   .  Colposcopy       x2   .  Lasik   2001   .  Eye surgery       lasic   .  Dilation and evacuation  N/A  09/20/2013     Procedure: DILATATION AND EVACUATION WITH FROZEN SECTION; Surgeon: Eldred Manges, MD; Location: [facility]; Service: Gynecology; Laterality: N/A;   .  Dilation and evacuation  N/A  11/20/2013     Procedure: DILATATION AND EVACUATION Under U/S Guidance; Surgeon: Eldred Manges, MD; Location: [facility]; Service: Gynecology; Laterality: N/A;    Past Medical Hx:  Past Medical History   Diagnosis  Date   .  AMA (advanced maternal age) multigravida 64+    .  H/O candidiasis    .  Abnormal Pap smear  2010   .  Hx: UTI (urinary tract infection)      Occasionally   .  H/O bladder infections    .  H/O varicella    .  Yeast infection    .  H/O constipation  11/14/11   .  Pneumonia      childhood   .  GERD (  gastroesophageal reflux disease)      with pregnancy   .  Anemia  11/14/11     with pregnancy now resolved   .  Type o blood, rh negative    Past Gynecological History: G2 P1 Last pregnancy was at term 2 years ago. No LMP recorded. Patient is not currently having periods (Reason: Other).  Family Hx:  Family History   Problem  Relation  Age of Onset   .  Hypertension  Father    .  Peripheral vascular disease  Father    .  Kidney disease  Father      nephrectomy   .  Cancer  Father      kidney   .  Heart disease  Paternal Aunt    .  Depression  Paternal Aunt    .  Cancer  Paternal Aunt      ovarian   .  Hypertension  Maternal Grandmother    .  Diabetes  Maternal Grandmother    .  Seizures  Maternal Grandmother      tia   .  Stroke  Maternal Grandmother    .   Cancer  Paternal Grandfather      prostate   .  Stroke  Maternal Grandfather     Review of Systems:  Constitutional  Feels well,  Pulmonary  No cough or hemoptysis  Gastro Intestinal  No nausea, vomitting, or diarrhoea. No bright red blood per rectum, no abdominal pain,  Genito Urinary  No vaginal bleeding or discharge LMP 07/2013  Vitals: BP 121/79  Pulse 68  Temp(Src) 97.8 F (36.6 C) (Oral)  Resp 16 Wt Readings from Last 3 Encounters:  12/27/13 137 lb 4.8 oz (62.279 kg)  11/14/13 137 lb 12.8 oz (62.506 kg)  09/20/13 130 lb (58.968 kg)    Physical Exam:  WD in NAD, skin flushing noted,  Good air movement.  Psychiatry  Alert and oriented, appropriate mood affect and speech.  Abdomen  Normoactive bowel sounds, abdomen soft, non-tender.  Back  No CVA tenderness  Extremities  No bilateral cyanosis, clubbing or edema.

## 2014-01-02 NOTE — Patient Instructions (Signed)
Methotrexate injection What is this medicine? METHOTREXATE (METH oh TREX ate) is a chemotherapy drug. This medicine affects cells that are rapidly growing, such as cancer cells and cells in your mouth and stomach. It is used to treat many cancers and other medical conditions. It is used for leukemias, lymphomas, breast cancer, lung cancer, head and neck cancers, and other cancers. This medicine also works on the immune system and is commonly used to treat psoriasis and rheumatoid arthritis. This medicine may be used for other purposes; ask your health care provider or pharmacist if you have questions. What should I tell my health care provider before I take this medicine? They need to know if you have any of these conditions: -if you frequently drink alcohol containing drinks -infection (especially a virus infection such as chickenpox, cold sores, or herpes) -immune system problems -kidney disease -liver disease -low blood counts, like platelets, red bloods, or white blood cells -lung disease -recent or ongoing radiation therapy -an unusual or allergic reaction to methotrexate, benzyl alcohol, other medicines, foods, dyes, or preservatives -pregnant or trying to get pregnant -breast-feeding How should I use this medicine? This drug is given as an injection into a muscle or into a vein. It may also be given into the spinal fluid. It is administered in a hospital or clinic by a specially trained health care professional. Talk to your pediatrician regarding the use of this medicine in children. While this drug may be prescribed for selected conditions, precautions do apply. Overdosage: If you think you have taken too much of this medicine contact a poison control center or emergency room at once. NOTE: This medicine is only for you. Do not share this medicine with others. What if I miss a dose? It is important not to miss your dose. Call your doctor or health care professional if you are unable  to keep an appointment. What may interact with this medicine? -antibiotics and other medicines for infections -aspirin and aspirin-like medicines including bismuth subsalicylate (Pepto-Bismol) -cisplatin -dapsone -folic acid in supplements or vitamins -mercaptopurine -NSAIDs, medicines for pain and inflammation, like ibuprofen or naproxen -pemetrexed -phenylbutazone -phenytoin -probenecid -pyrimethamine -theophylline -trimetrexate -vaccines This list may not describe all possible interactions. Give your health care provider a list of all the medicines, herbs, non-prescription drugs, or dietary supplements you use. Also tell them if you smoke, drink alcohol, or use illegal drugs. Some items may interact with your medicine. What should I watch for while using this medicine? Visit your doctor for checks on your progress. You will need to have regular blood checks during your treatment to monitor your blood, liver function, and kidney function. This drug may make you feel generally unwell. This is not uncommon, as chemotherapy can affect healthy cells as well as cancer cells. Report any side effects. Continue your course of treatment even though you feel ill unless your doctor tells you to stop. In some cases, you may be given additional medicines to help with side effects. Follow all directions for their use. Call your doctor or health care professional for advice if you get a fever, chills or sore throat, or other symptoms of a cold or flu. Do not treat yourself. This drug decreases your body's ability to fight infections. Try to avoid being around people who are sick. This medicine may increase your risk to bruise or bleed. Call your doctor or health care professional if you notice any unusual bleeding. Be careful brushing and flossing your teeth or using a toothpick because   infections. Try to avoid being around people who are sick.  This medicine may increase your risk to bruise or bleed. Call your doctor or health care professional if you notice any unusual bleeding.  Be careful brushing and flossing your teeth or using a toothpick because you may get an infection or bleed more easily. If you have any dental work done, tell your dentist you are receiving  this medicine.  Avoid taking products that contain aspirin, acetaminophen, ibuprofen, naproxen, or ketoprofen unless instructed by your doctor. These medicines may hide a fever.  This medicine can make you more sensitive to the sun. Keep out of the sun. If you cannot avoid being in the sun, wear protective clothing and use sunscreen. Do not use sun lamps or tanning beds/booths.  Do not treat diarrhea with over the counter products. Contact your doctor if you have diarrhea.  To protect your kidneys, drink water or other fluids as directed while you are taking this medicine.  Do not drink alcohol-containing drinks while taking this medicine. Both alcohol and the medicine may cause damage to your liver.  Men and women must use effective birth control while they are taking this medicine. Do not become pregnant while taking this medicine. Women must continue using effective birth control for 1 full menstrual cycle after stopping this medicine. Tell your doctor right away if you think that you or your partner might be pregnant. There is a potential for serious side effects to an unborn child. Talk to your health care professional or pharmacist for more information. Do not breast-feed an infant while taking this medicine. Men must continue effective birth control for 3 months after stopping this medicine.  What side effects may I notice from receiving this medicine?  Side effects that you should report to your doctor or health care professional as soon as possible:  -allergic reactions like skin rash, itching or hives, swelling of the face, lips, or tongue  -low blood counts - this medicine may decrease the number of white blood cells, red blood cells and platelets. You may be at increased risk for infections and bleeding.  -signs of infection - fever or chills, cough, sore throat, pain or difficulty passing urine  -signs of decreased platelets or bleeding - bruising, pinpoint red spots on the skin, black, tarry stools,  blood in the urine  -signs of decreased red blood cells - unusually weak or tired, fainting spells, lightheadedness  -breathing problems, like a dry cough  -changes in vision  -confusion, not alert  -diarrhea  -mouth or throat sores or ulcers  -problems with balance, talking, walking  -redness, blistering, peeling or loosening of the skin, including inside the mouth  -seizures  -trouble passing urine or change in the amount of urine  -vomiting  -yellowing of the eyes or skin  Side effects that usually do not require medical attention (report to your doctor or health care professional if they continue or are bothersome):  -change in skin color  -eye irritation  -hair loss  -headache  -loss of appetite  -nausea  -stomach upset  This list may not describe all possible side effects. Call your doctor for medical advice about side effects. You may report side effects to FDA at 1-800-FDA-1088.  Where should I keep my medicine?  This drug is given in a hospital or clinic and will not be stored at home.  NOTE: This sheet is a summary. It may not cover all possible information. If you have questions about

## 2014-01-02 NOTE — Telephone Encounter (Signed)
Office Note: Gyn-Onc  Lynn Ibarra 43 y.o. 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 Significant findings are that her age is 4 years, the antecedent pregnancy was a live born infant at term 2 years ago, the highest pretreatment  hCG is 52,590, the  uterine size at the time of D&C was likely 8-10 cm given the fact that a #8 suction catheter was used.  A chest x-ray and CT of the abdomen and pelvis have been ordered in addition to the WBC and chemistry panel. WHO score will be calculated when the additional information is obtained.  The options presented to [name] and her husband if no metastatic disease is identified and her WHO is  remains =1 (low risk,  non metastatic)  were 1: repeat D&C  2: single agent chemotherapy,  either weekly methotrexate with folic acid rescue or actinomycin D every two-weeks or 3: hysterectomy.  They're not sure whether they are satisfied with parity and wish to proceed with a repeat D&C. They understand that if there is no further fall of the hCG then single agent chemotherapy will be recommended.  If metastatic disease is noted on imaging the  WHO score will  determine the level of risk of this gestational trophoblastic neoplasia and dictate the number of agents necessary for treatment.  I reassured them that this is one of the most treatable and curable malignancies.  All of their questions were answered to their satisfaction.  Follow-up after D&C Beta HCG weekly after D&C until normalization Advised to use an effective and reliable form of birth control until one year post normalization of beta HCG.  History of Present Illness:   Lynn Ibarra  is a 43 y.o.  gravida 2 para 1 last normal menstrual period 07/27/2013. On 09/27/2013 she underwent dilation and evacuation for presumed missed AB. The highest beta hCG prior to that procedure was 52,590 on 09/17/2013 .  At the  time of uterine evacuate evacuation the presence of chorionic villi was confirmed.  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  Subsequent beta hCGs are as follows  10/02/2014  301.4 10/11/2013 41.7 10/23/2013  25.94 10/30/2013  56.8 11/05/2013  74.8 11/13/2013  92.4  Lynn Ibarra's blood type is O- the last pregnancy was 2 years ago that resulted in a live born infant at term, she denies cough, shortness of breath, hemoptysis.  She denies abdominal pain or vaginal bleeding.  She is undecided regarding satisfaction of her parity.    She received RhoGAM after the D&C.  Imaging studies without evidence of mets. WHO GTD score  =1 (low risk,  non metastatic)   She underwent repeat D&C. 11/20/13 11-20-2013 88  11-11-2013 61.8  12-22-2013 80.2  12-26-2013 69 12-27-2013   72.8  Discussion held with Dr and [name] regarding intervention  Actinomycin D vs MTX vs hys.  The plan is for weekly treatment with MTX for two cycles past a normal HCG.         Current Meds:  Outpatient Encounter Prescriptions as of 01/02/2014  Medication Sig  . docusate sodium (COLACE) 100 MG capsule Take 200 mg by mouth daily as needed for mild constipation.  Marland Kitchen ibuprofen (ADVIL,MOTRIN) 600 MG tablet Take 600 mg by mouth every 6 (six) hours as needed for mild pain or moderate pain.  Marland Kitchen LORazepam (ATIVAN) 0.5 MG tablet Place 1 tablet under the tongue or swallow every 4-6 hours as  needed for nausea.  Will make drowsy.  . ondansetron (ZOFRAN) 8 MG tablet Take 1-2 tabs every 12 hours as needed for nausea. Will not make sleepy.  . VESTURA 3-0.02 MG tablet Take 1 tablet by mouth daily.     Past Surgical Hx:  Past Surgical History  Procedure Laterality Date  . Colposcopy      x2  . Lasik  2001  . Eye surgery      lasic  . Dilation and evacuation N/A 09/20/2013    Procedure: DILATATION AND EVACUATION WITH FROZEN SECTION;  Surgeon: Eldred Manges, MD;  Location: [facility];  Service: Gynecology;   Laterality: N/A;  . Dilation and evacuation N/A 11/20/2013    Procedure: DILATATION AND EVACUATION Under U/S Guidance;  Surgeon: Eldred Manges, MD;  Location: [facility];  Service: Gynecology;  Laterality: N/A;    Past Medical Hx:  Past Medical History  Diagnosis Date  . AMA (advanced maternal age) multigravida 30+   . H/O candidiasis   . Abnormal Pap smear 2010  . Hx: UTI (urinary tract infection)     Occasionally  . H/O bladder infections   . H/O varicella   . Yeast infection   . H/O constipation 11/14/11  . Pneumonia     childhood  . GERD (gastroesophageal reflux disease)     with pregnancy  . Anemia 11/14/11    with pregnancy now resolved  . Type o blood, rh negative     Past Gynecological History:  G2 P1 Last pregnancy was at term 2 years ago.  No LMP recorded. Patient is not currently having periods (Reason: Other).  Family Hx:  Family History  Problem Relation Age of Onset  . Hypertension Father   . Peripheral vascular disease Father   . Kidney disease Father     nephrectomy  . Cancer Father     kidney  . Heart disease Paternal Aunt   . Depression Paternal Aunt   . Cancer Paternal Aunt     ovarian  . Hypertension Maternal Grandmother   . Diabetes Maternal Grandmother   . Seizures Maternal Grandmother     tia  . Stroke Maternal Grandmother   . Cancer Paternal Grandfather     prostate  . Stroke Maternal Grandfather     Review of Systems:  Constitutional  Feels well, Cardiovascular  No chest pain, shortness of breath, or edema  Pulmonary  No cough or hemoptysis Gastro Intestinal  No nausea, vomitting, or diarrhoea. No bright red blood per rectum, no abdominal pain, change in bowel movement, or constipation.  Genito Urinary  No vaginal bleeding or discharge    Vitals:  Blood pressure 118/74, pulse 78, temperature 97.5 F (36.4 C), temperature source Oral, resp. rate 16, weight 137 lb 12.8 oz (62.506 kg), last menstrual period 07/27/2013.  Physical  Exam: WD in NAD, skin flushing noted,  Neck  Supple NROM, without any enlargements.  Lymph Node Survey No cervical supraclavicular or inguinal adenopathy Cardiovascular  Pulse normal rate, regularity and rhythm. S1 and S2 normal.  Lungs  Clear to auscultation bilaterally  Good air movement.  Psychiatry  Alert and oriented to person, place, and time, appropriate mood affect and speech. Abdomen  Normoactive bowel sounds, abdomen soft, non-tender. Back No CVA tenderness Genito Urinary  Vulva/vagina: Normal external female genitalia.  No lesions.   Bladder/urethra:  No lesions or masses  Vagina: no lesions, no vaginal bleeding  Cervix: Normal appearing, no lesions.  Uterus: Small, mobile, no parametrial involvement  or nodularity.  Adnexa: No palpable masses. Rectal  Good tone, no masses no cul de sac nodularity.  Extremities  No bilateral cyanosis, clubbing or edema.   Janie Morning, MD, PhD 01/02/2014, 10:31 AM

## 2014-01-06 ENCOUNTER — Telehealth: Payer: Self-pay

## 2014-01-06 NOTE — Telephone Encounter (Signed)
Spoke withMs. Lynn Ibarra and explained that she needed to come a day prior to the visit and treatment so that the B-HCG level would be available at her visit with Dr. Marko Plume on 01-10-14. [name] verbalized understanding.

## 2014-01-09 ENCOUNTER — Other Ambulatory Visit (HOSPITAL_BASED_OUTPATIENT_CLINIC_OR_DEPARTMENT_OTHER): Payer: Managed Care, Other (non HMO)

## 2014-01-09 ENCOUNTER — Other Ambulatory Visit (HOSPITAL_COMMUNITY): Payer: Managed Care, Other (non HMO)

## 2014-01-09 DIAGNOSIS — O019 Hydatidiform mole, unspecified: Secondary | ICD-10-CM

## 2014-01-09 DIAGNOSIS — D392 Neoplasm of uncertain behavior of placenta: Secondary | ICD-10-CM

## 2014-01-09 LAB — COMPREHENSIVE METABOLIC PANEL (CC13)
ALT: 12 U/L (ref 0–55)
AST: 15 U/L (ref 5–34)
Albumin: 3.8 g/dL (ref 3.5–5.0)
Alkaline Phosphatase: 47 U/L (ref 40–150)
Anion Gap: 10 mEq/L (ref 3–11)
BUN: 11.9 mg/dL (ref 7.0–26.0)
CHLORIDE: 106 meq/L (ref 98–109)
CO2: 22 mEq/L (ref 22–29)
CREATININE: 0.9 mg/dL (ref 0.6–1.1)
Calcium: 9.3 mg/dL (ref 8.4–10.4)
Glucose: 90 mg/dl (ref 70–140)
POTASSIUM: 4.3 meq/L (ref 3.5–5.1)
Sodium: 138 mEq/L (ref 136–145)
Total Bilirubin: 0.46 mg/dL (ref 0.20–1.20)
Total Protein: 7.3 g/dL (ref 6.4–8.3)

## 2014-01-09 LAB — CBC WITH DIFFERENTIAL/PLATELET
BASO%: 0.3 % (ref 0.0–2.0)
Basophils Absolute: 0 10*3/uL (ref 0.0–0.1)
EOS%: 0.4 % (ref 0.0–7.0)
Eosinophils Absolute: 0 10*3/uL (ref 0.0–0.5)
HCT: 38.6 % (ref 34.8–46.6)
HGB: 13 g/dL (ref 11.6–15.9)
LYMPH#: 2.2 10*3/uL (ref 0.9–3.3)
LYMPH%: 34.3 % (ref 14.0–49.7)
MCH: 31.6 pg (ref 25.1–34.0)
MCHC: 33.7 g/dL (ref 31.5–36.0)
MCV: 93.6 fL (ref 79.5–101.0)
MONO#: 0.5 10*3/uL (ref 0.1–0.9)
MONO%: 7.2 % (ref 0.0–14.0)
NEUT#: 3.6 10*3/uL (ref 1.5–6.5)
NEUT%: 57.8 % (ref 38.4–76.8)
Platelets: 198 10*3/uL (ref 145–400)
RBC: 4.12 10*6/uL (ref 3.70–5.45)
RDW: 12.2 % (ref 11.2–14.5)
WBC: 6.3 10*3/uL (ref 3.9–10.3)

## 2014-01-10 ENCOUNTER — Ambulatory Visit (HOSPITAL_BASED_OUTPATIENT_CLINIC_OR_DEPARTMENT_OTHER): Payer: Managed Care, Other (non HMO) | Admitting: Oncology

## 2014-01-10 ENCOUNTER — Encounter: Payer: Self-pay | Admitting: Oncology

## 2014-01-10 ENCOUNTER — Ambulatory Visit: Payer: Managed Care, Other (non HMO)

## 2014-01-10 ENCOUNTER — Telehealth: Payer: Self-pay | Admitting: Oncology

## 2014-01-10 VITALS — BP 118/77 | HR 62 | Temp 98.2°F | Resp 18 | Ht 65.0 in | Wt 138.7 lb

## 2014-01-10 DIAGNOSIS — D392 Neoplasm of uncertain behavior of placenta: Secondary | ICD-10-CM

## 2014-01-10 DIAGNOSIS — O019 Hydatidiform mole, unspecified: Secondary | ICD-10-CM

## 2014-01-10 LAB — HCG, QUANTITATIVE, PREGNANCY

## 2014-01-10 NOTE — Progress Notes (Signed)
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 Although BHCG had been 69 on 12-26-13 and  72.8 on 12-27-13, the marker was down to 7 prior to MTX on 01-02-14. Patient had minimal discomfort at injection site, which has resolved, mild nausea first couple of days which improved with antiemetics and still able to take po's, significant pelvic cramping over ~ next 3 days described as "bad menstrual cramps" also now resolved, no vaginal bleeding. She spoke with Dr Leo Grosser by phone re the cramping. She had one sore area right posterior buccal mucosa, no diarrhea, no other mucositis. She has been a little more fatigued since the MTX, but still going about regular activities.  Note treatment was administered earlier than originally scheduled on 3-19 at request of patient, the marker not resulted prior to giving the MTX. The sample was then rerun at my request, level confirmed.  Dr Skeet Latch discussed with her by phone after beta HCG result from 3-19. Repeat level 01-09-14 was <2.0. I spoke with Dr Skeet Latch by phone prior to today's visit re this marker result.   ONCOLOGIC HISTORY Patient had LMP 07-27-13, but appeared to have failed IUP by early Dec. She had dilatation and evacuation by Dr Leo Grosser on 09-20-13, with pathology ([id number]) showing complete hydatidiform mole. Beta HCG, which was 52,590 on 09-17-14, progressively dropped over next several weeks to low of 25.9 on 10-23-13, then began rising and was 92 when she was seen in consultation by Dr Skeet Latch on 11-14-13. 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 She had repeat D&E under US guidance by Dr Leo Grosser on 11-20-13, with pathology ([id number]) benign secretory  endometrium. Beta HCG 61 - 80 since that D&E, including 69 on 12-26-13 and 72.8 on 12-27-13. Dr Skeet Latch discussed options of hysterectomy vs single agent chemotherapy with MTX or actinomycin D. Patient preferred chemotherapy to hysterectomy and did not want PICC, so treatment began with MTX on 01-02-14. Beta HCG drawn 3-19 and resulted after the treatment was 7; beta HCG on 01-09-14 was <2.  Review of systems as above, also: No fever. No SOB or cough. Slight constipation Remainder of 10 point Review of Systems negative.  Objective:  Vital signs in last 24 hours:  BP 118/77  Pulse 62  Temp(Src) 98.2 F (36.8 C) (Oral)  Resp 18  Ht 5\' 5"  (1.651 m)  Wt 138 lb 11.2 oz (62.914 kg)  BMI 23.08 kg/m2  Alert, oriented and appropriate. Easily ambulatory. Very pleasant, looks comfortable. No alopecia  HEENT:PERRL, sclerae not icteric. Oral mucosa moist without lesions, including posterior right buccal area, posterior pharynx clear.  Neck supple. No JVD.  Lymphatics:no cervical,suraclavicular adenopathy Resp: clear to auscultation bilaterally and normal percussion bilaterally Cardio: regular rate and rhythm. No gallop. GI: soft, nontender, not distended, no mass or organomegaly. Normally active bowel sounds.  Musculoskeletal/ Extremities: without pitting edema, cords, tenderness Neuro:  nonfocal  PSYCH normal mood and affect Skin without rash, ecchymosis, petechiae   Lab Results:  Results for orders placed in visit on 01/09/14  HCG, QUANTITATIVE, PREGNANCY      Result Value Ref Range   hCG, Beta Chain, Quant, S <2.0    CBC WITH DIFFERENTIAL      Result Value Ref Range  WBC 6.3  3.9 - 10.3 10e3/uL   NEUT# 3.6  1.5 - 6.5 10e3/uL   HGB 13.0  11.6 - 15.9 g/dL   HCT 38.6  34.8 - 46.6 %   Platelets 198  145 - 400 10e3/uL   MCV 93.6  79.5 - 101.0 fL   MCH 31.6  25.1 - 34.0 pg   MCHC 33.7  31.5 - 36.0 g/dL   RBC 4.12  3.70 - 5.45 10e6/uL   RDW 12.2  11.2 - 14.5 %   lymph# 2.2  0.9 - 3.3  10e3/uL   MONO# 0.5  0.1 - 0.9 10e3/uL   Eosinophils Absolute 0.0  0.0 - 0.5 10e3/uL   Basophils Absolute 0.0  0.0 - 0.1 10e3/uL   NEUT% 57.8  38.4 - 76.8 %   LYMPH% 34.3  14.0 - 49.7 %   MONO% 7.2  0.0 - 14.0 %   EOS% 0.4  0.0 - 7.0 %   BASO% 0.3  0.0 - 2.0 %  COMPREHENSIVE METABOLIC PANEL (LN98)      Result Value Ref Range   Sodium 138  136 - 145 mEq/L   Potassium 4.3  3.5 - 5.1 mEq/L   Chloride 106  98 - 109 mEq/L   CO2 22  22 - 29 mEq/L   Glucose 90  70 - 140 mg/dl   BUN 11.9  7.0 - 26.0 mg/dL   Creatinine 0.9  0.6 - 1.1 mg/dL   Total Bilirubin 0.46  0.20 - 1.20 mg/dL   Alkaline Phosphatase 47  40 - 150 U/L   AST 15  5 - 34 U/L   ALT 12  0 - 55 U/L   Total Protein 7.3  6.4 - 8.3 g/dL   Albumin 3.8  3.5 - 5.0 g/dL   Calcium 9.3  8.4 - 10.4 mg/dL   Anion Gap 10  3 - 11 mEq/L    Labs above reviewed with patient  Studies/Results:  No results found.  Medications: I have reviewed the patient's current medications. She continues oral BCP, tolerating without difficulty. Suggested baking soda in water or biotene mouthwash if discomfort.  DISCUSSION: Patient is in agreement with holding further chemotherapy as we watch weekly beta HCG, which we will draw on Thursdays 4-2 and 4-9, then she will be out of town 4-16 so level will be done instead on 4-20. She is to see Dr Skeet Latch on 4-23, lab also that day then to continue on Thursdays. She will limit sun exposure next couple of weeks due to recent MTX.  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 Follow weekly levels without further systemic chemotherapy for now.  2.on OCP as close follow up of beta HCG continues.    Patient has had questions answered and is in agreement with plan     Gordy Levan, MD   01/10/2014, 12:33 PM

## 2014-01-10 NOTE — Patient Instructions (Signed)
Baking soda in water or biotene mouthwash if soreness.  Avoid too much sun for next 2-3 weeks, then should be ok

## 2014-01-10 NOTE — Telephone Encounter (Signed)
, °

## 2014-01-11 ENCOUNTER — Other Ambulatory Visit: Payer: Self-pay | Admitting: Oncology

## 2014-01-12 ENCOUNTER — Other Ambulatory Visit: Payer: Self-pay | Admitting: Oncology

## 2014-01-13 ENCOUNTER — Telehealth: Payer: Self-pay | Admitting: Oncology

## 2014-01-13 NOTE — Telephone Encounter (Signed)
lvm for pt regarding to 4.3 and 4.1.15 appt cx.Marland KitchenMarland KitchenMarland Kitchen

## 2014-01-15 ENCOUNTER — Ambulatory Visit: Payer: Managed Care, Other (non HMO) | Admitting: Oncology

## 2014-01-15 ENCOUNTER — Other Ambulatory Visit: Payer: Managed Care, Other (non HMO)

## 2014-01-16 ENCOUNTER — Other Ambulatory Visit (HOSPITAL_BASED_OUTPATIENT_CLINIC_OR_DEPARTMENT_OTHER): Payer: Managed Care, Other (non HMO)

## 2014-01-16 DIAGNOSIS — O019 Hydatidiform mole, unspecified: Secondary | ICD-10-CM

## 2014-01-16 DIAGNOSIS — D392 Neoplasm of uncertain behavior of placenta: Secondary | ICD-10-CM

## 2014-01-16 LAB — CBC WITH DIFFERENTIAL/PLATELET
BASO%: 0.2 % (ref 0.0–2.0)
BASOS ABS: 0 10*3/uL (ref 0.0–0.1)
EOS ABS: 0.1 10*3/uL (ref 0.0–0.5)
EOS%: 0.6 % (ref 0.0–7.0)
HEMATOCRIT: 39.9 % (ref 34.8–46.6)
HGB: 13.4 g/dL (ref 11.6–15.9)
LYMPH%: 26.8 % (ref 14.0–49.7)
MCH: 31.4 pg (ref 25.1–34.0)
MCHC: 33.6 g/dL (ref 31.5–36.0)
MCV: 93.6 fL (ref 79.5–101.0)
MONO#: 0.7 10*3/uL (ref 0.1–0.9)
MONO%: 7.5 % (ref 0.0–14.0)
NEUT%: 64.9 % (ref 38.4–76.8)
NEUTROS ABS: 5.8 10*3/uL (ref 1.5–6.5)
PLATELETS: 207 10*3/uL (ref 145–400)
RBC: 4.26 10*6/uL (ref 3.70–5.45)
RDW: 12.4 % (ref 11.2–14.5)
WBC: 8.9 10*3/uL (ref 3.9–10.3)
lymph#: 2.4 10*3/uL (ref 0.9–3.3)

## 2014-01-16 LAB — COMPREHENSIVE METABOLIC PANEL (CC13)
ALK PHOS: 49 U/L (ref 40–150)
ALT: 9 U/L (ref 0–55)
AST: 16 U/L (ref 5–34)
Albumin: 3.8 g/dL (ref 3.5–5.0)
Anion Gap: 11 mEq/L (ref 3–11)
BILIRUBIN TOTAL: 0.44 mg/dL (ref 0.20–1.20)
BUN: 15.2 mg/dL (ref 7.0–26.0)
CO2: 20 mEq/L — ABNORMAL LOW (ref 22–29)
CREATININE: 1.1 mg/dL (ref 0.6–1.1)
Calcium: 9.1 mg/dL (ref 8.4–10.4)
Chloride: 104 mEq/L (ref 98–109)
GLUCOSE: 87 mg/dL (ref 70–140)
Potassium: 4.3 mEq/L (ref 3.5–5.1)
Sodium: 135 mEq/L — ABNORMAL LOW (ref 136–145)
Total Protein: 7.2 g/dL (ref 6.4–8.3)

## 2014-01-16 LAB — HCG, QUANTITATIVE, PREGNANCY: hCG, Beta Chain, Quant, S: <2 m[IU]/mL

## 2014-01-17 ENCOUNTER — Ambulatory Visit: Payer: Managed Care, Other (non HMO)

## 2014-01-20 ENCOUNTER — Telehealth: Payer: Self-pay

## 2014-01-20 NOTE — Telephone Encounter (Signed)
Told Ms. Mosteller the results of the BHCG Level as noted below by Dr. Marko Plume.

## 2014-01-20 NOTE — Telephone Encounter (Signed)
Message copied by Baruch Merl on Mon Jan 20, 2014  7:23 PM ------      Message from: Evlyn Clines P      Created: Thu Jan 16, 2014  9:08 PM       Labs seen and need follow up: on 4-3, please let her know that BHCG is <2.0 again, and that blood counts are back up to levels she had before MTX, and chemistries are all good. Melissa or my RN can let patient know -- thanks.  I have routed the [facility] to Dr Skeet Latch also. ------

## 2014-01-23 ENCOUNTER — Other Ambulatory Visit (HOSPITAL_BASED_OUTPATIENT_CLINIC_OR_DEPARTMENT_OTHER): Payer: Managed Care, Other (non HMO)

## 2014-01-23 DIAGNOSIS — O019 Hydatidiform mole, unspecified: Secondary | ICD-10-CM

## 2014-01-23 DIAGNOSIS — D392 Neoplasm of uncertain behavior of placenta: Secondary | ICD-10-CM

## 2014-01-24 ENCOUNTER — Ambulatory Visit: Payer: Managed Care, Other (non HMO)

## 2014-01-24 LAB — HCG, QUANTITATIVE, PREGNANCY: hCG, Beta Chain, Quant, S: <2 m[IU]/mL

## 2014-01-27 ENCOUNTER — Telehealth: Payer: Self-pay | Admitting: *Deleted

## 2014-01-27 NOTE — Telephone Encounter (Signed)
Message copied by Patton Salles on Mon Jan 27, 2014  9:16 AM ------      Message from: Gordy Levan      Created: Fri Jan 24, 2014  6:55 PM       Labs seen and need follow up:       please let her know that BHCG was in good range again at <2.0. If she is still out of town next week, remind her that she will have lab on 4-20 (if she is not out of town next week, should have lab again on 4-16 on weekly schedule).       She will need additional lab appointments set up from Dr Leone Brand visit on 4-23.            Cc Thornell Mule ------

## 2014-01-27 NOTE — Telephone Encounter (Signed)
Pt notified of results below, states she will be out of town this week. Will have labs on 4 /20

## 2014-02-03 ENCOUNTER — Other Ambulatory Visit (HOSPITAL_BASED_OUTPATIENT_CLINIC_OR_DEPARTMENT_OTHER): Payer: Managed Care, Other (non HMO)

## 2014-02-03 DIAGNOSIS — D392 Neoplasm of uncertain behavior of placenta: Secondary | ICD-10-CM

## 2014-02-03 DIAGNOSIS — O019 Hydatidiform mole, unspecified: Secondary | ICD-10-CM

## 2014-02-03 LAB — HCG, QUANTITATIVE, PREGNANCY: hCG, Beta Chain, Quant, S: <2 m[IU]/mL

## 2014-02-04 ENCOUNTER — Telehealth: Payer: Self-pay | Admitting: *Deleted

## 2014-02-04 NOTE — Telephone Encounter (Signed)
Message copied by Sharlynn Oliphant A on Tue Feb 04, 2014  3:24 PM ------      Message from: Gordy Levan      Created: Tue Feb 04, 2014  3:10 PM       Labs seen and need follow up: please let her know this was in good range again at <2.0. She is to see Dr Skeet Latch later this week; she needs to have further BHCG set up from that appointment. Please be sure Mary/ Melissa Cross set up [facility] on whatever schedule Dr Skeet Latch recommends, or that we do it. Either [place] or gyn onc will need to let patient know each result and copy result to Dr Skeet Latch.  thanks ------

## 2014-02-04 NOTE — Telephone Encounter (Signed)
Left message with results below 

## 2014-02-05 ENCOUNTER — Telehealth: Payer: Self-pay | Admitting: Gynecologic Oncology

## 2014-02-05 NOTE — Telephone Encounter (Signed)
Office Note: Gyn-Onc   [employer] 43 y.o.   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 Significant findings are that her age is 9 years, the antecedent pregnancy was a live born infant at term 2 years ago, the highest pretreatment hCG is 52,590, the uterine size at the time of D&C was likely 8-10 cm given the fact that a #8 suction catheter was used.   WHO GTD score =1 (low risk, non metastatic)  She underwent repeat D&C. 11/20/13    A chest x-ray and CT of the abdomen and pelvis was notable for a 41mm pulmonary nodule. Repeat Chest CT 02/2014    Advised to continue use of OCP until at least six months after normalization of HCG.  History of Present Illness:  Lynn Ibarra is a 43 y.o. gravida 2 para 1 last normal menstrual period 07/27/2013. On 09/27/2013 she underwent dilation and evacuation for presumed missed AB. The highest beta hCG prior to that procedure was 52,590 on 09/17/2013 . At the time of uterine evacuate evacuation the presence of chorionic villi was confirmed. 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  Subsequent beta hCGs are as follows  10/02/2014 301.4  10/11/2013 41.7  10/23/2013 25.94  10/30/2013 56.8  11/05/2013 74.8  11/13/2013 92.4  Lynn Ibarra's blood type is O- the last pregnancy was 2 years ago that resulted in a live born infant at term, she denies cough, shortness of breath, hemoptysis. She denies abdominal pain or vaginal bleeding. She is undecided regarding satisfaction of her parity. She received RhoGAM after the D&C.  Imaging studies without evidence of mets.  WHO GTD score =1 (low risk, non metastatic)  She underwent repeat D&C. 11/20/13  11-20-2013 88  11-11-2013 61.8  12-22-2013 80.2  12-26-2013 69  12-27-2013 72.8  Discussion held with Dr and Mrs. Fredin regarding intervention Actinomycin D vs MTX vs hys. The plan was for weekly  treatment with MTX for two cycles past a normal.   Cycle # 1 was administered on 01/2014 HCG collected that day prior to MTX returned 7.0.    Subsequent HCG <2   Past Surgical Hx:  Past Surgical History   Procedure  Laterality  Date   .  Colposcopy       x2   .  Lasik   2001   .  Eye surgery       lasic   .  Dilation and evacuation  N/A  09/20/2013     Procedure: DILATATION AND EVACUATION WITH FROZEN SECTION; Surgeon: Eldred Manges, MD; Location: [facility]; Service: Gynecology; Laterality: N/A;   .  Dilation and evacuation  N/A  11/20/2013     Procedure: DILATATION AND EVACUATION Under U/S Guidance; Surgeon: Eldred Manges, MD; Location: [facility]; Service: Gynecology; Laterality: N/A;    Past Medical Hx:  Past Medical History   Diagnosis  Date   .  AMA (advanced maternal age) multigravida 35+    .  H/O candidiasis    .  Abnormal Pap smear  2010   .  Hx: UTI (urinary tract infection)      Occasionally   .  H/O bladder infections    .  H/O varicella    .  Yeast infection    .  H/O constipation  11/14/11   .  Pneumonia      childhood   .  GERD (gastroesophageal reflux disease)  with pregnancy   .  Anemia  11/14/11     with pregnancy now resolved   .  Type o blood, rh negative     Past Gynecological History: G2 P1 Last pregnancy was at term 2 years ago. No LMP recorded. Patient is not currently having periods (Reason: Other). OCP  Family Hx:  Family History   Problem  Relation  Age of Onset   .  Hypertension  Father    .  Peripheral vascular disease  Father    .  Kidney disease  Father      nephrectomy   .  Cancer  Father      kidney   .  Heart disease  Paternal Aunt    .  Depression  Paternal Aunt    .  Cancer  Paternal Aunt      ovarian   .  Hypertension  Maternal Grandmother    .  Diabetes  Maternal Grandmother    .  Seizures  Maternal Grandmother      tia   .  Stroke  Maternal Grandmother    .  Cancer  Paternal Grandfather      prostate   .  Stroke   Maternal Grandfather     Review of Systems:  Constitutional  Feels well,  Pulmonary  No cough or hemoptysis  Gastro Intestinal  No nausea, vomitting, or diarrhoea. No bright red blood per rectum, no abdominal pain,  Genito Urinary  No vaginal bleeding or discharge LMP 07/2013  Vitals: BP 121/79  Pulse 68  Temp(Src) 97.8 F (36.6 C) (Oral)  Resp 16 Wt Readings from Last 3 Encounters:  01/10/14 138 lb 11.2 oz (62.914 kg)  12/27/13 137 lb 4.8 oz (62.279 kg)  11/14/13 137 lb 12.8 oz (62.506 kg)    Physical Exam:  WD in NAD, skin flushing noted,  Good air movement.  Psychiatry  Alert and oriented, appropriate mood affect and speech.  Abdomen  Normoactive bowel sounds, abdomen soft, non-tender.  Back  No CVA tenderness  Extremities  No bilateral cyanosis, clubbing or edema.

## 2014-02-06 ENCOUNTER — Encounter: Payer: Self-pay | Admitting: Gynecologic Oncology

## 2014-02-06 ENCOUNTER — Ambulatory Visit: Payer: Managed Care, Other (non HMO) | Attending: Gynecologic Oncology | Admitting: Gynecologic Oncology

## 2014-02-06 ENCOUNTER — Other Ambulatory Visit: Payer: Managed Care, Other (non HMO)

## 2014-02-06 ENCOUNTER — Telehealth: Payer: Self-pay | Admitting: *Deleted

## 2014-02-06 VITALS — BP 108/76 | Temp 98.0°F | Resp 16 | Ht 65.0 in | Wt 137.8 lb

## 2014-02-06 DIAGNOSIS — O019 Hydatidiform mole, unspecified: Secondary | ICD-10-CM

## 2014-02-06 NOTE — Patient Instructions (Signed)
[place] Follow-up with Gyn Onc in 3 months

## 2014-02-06 NOTE — Progress Notes (Signed)
Office Note: Gyn-Onc   [employer] 43 y.o.   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 Significant findings are that her age is 89 years, the antecedent pregnancy was a live born infant at term 2 years ago, the highest pretreatment hCG is 52,590, the uterine size at the time of D&C was likely 8-10 cm given the fact that a #8 suction catheter was used.   WHO GTD score =1 (low risk, non metastatic)  She underwent repeat D&C. 11/20/13  Received one cycle MTX 01/02/2014. Desires to pursue fertility options. Advised to delay pregnancy until 07/12/2014 F/U in 3 months  Amenorrhea. LMP 07/2014 on OCP without breakthrough bleed.   A chest x-ray and CT of the abdomen and pelvis was notable for a 38mm pulmonary nodule. Repeat Chest CT 02/2014   History of Present Illness:  [name] is a 42 y.o. gravida 2 para 1 last normal menstrual period 07/27/2013. On 09/27/2013 she underwent dilation and evacuation for presumed missed AB. The highest beta hCG prior to that procedure was 52,590 on 09/17/2013 . At the time of uterine evacuate evacuation the presence of chorionic villi was confirmed. 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  Subsequent beta hCGs are as follows  10/02/2014 301.4  10/11/2013 41.7  10/23/2013 25.94  10/30/2013 56.8  11/05/2013 74.8  11/13/2013 92.4  Lynn Ibarra's blood type is O- the last pregnancy was 2 years ago that resulted in a live born infant at term, she denies cough, shortness of breath, hemoptysis. She denies abdominal pain or vaginal bleeding. She is undecided regarding satisfaction of her parity. She received RhoGAM after the D&C.  Imaging studies without evidence of mets.  WHO GTD score =1 (low risk, non metastatic)  She underwent repeat D&C. 11/20/13  11-20-2013 88  11-11-2013 61.8  12-22-2013 80.2  12-26-2013 69  12-27-2013 72.8  Discussion held  with Dr and Mrs. Aspinall regarding intervention Actinomycin D vs MTX vs hys. The plan was for weekly treatment with MTX for two cycles past a normal.  HCG 12/27/2013 was 72.8   Cycle # 1 was administered on 01/02/2014 before HCG result was available.  The  HCG was collected that day,  prior to MTX returned 7.0.    Subsequent HCG all <2  Denies menses since 07/2013.  Reports an episode of cramping without pain.  Would like to have more children.   Past Surgical Hx:  Past Surgical History   Procedure  Laterality  Date   .  Colposcopy       x2   .  Lasik   2001   .  Eye surgery       lasic   .  Dilation and evacuation  N/A  09/20/2013     Procedure: DILATATION AND EVACUATION WITH FROZEN SECTION; Surgeon: Eldred Manges, MD; Location: [facility]; Service: Gynecology; Laterality: N/A;   .  Dilation and evacuation  N/A  11/20/2013     Procedure: DILATATION AND EVACUATION Under U/S Guidance; Surgeon: Eldred Manges, MD; Location: [facility]; Service: Gynecology; Laterality: N/A;    Past Medical Hx:  Past Medical History   Diagnosis  Date   .  AMA (advanced maternal age) multigravida 47+    .  H/O candidiasis    .  Abnormal Pap smear  2010   .  Hx: UTI (urinary tract infection)      Occasionally   .  H/O bladder infections    .  H/O varicella    .  Yeast infection    .  H/O constipation  11/14/11   .  Pneumonia      childhood   .  GERD (gastroesophageal reflux disease)      with pregnancy   .  Anemia  11/14/11     with pregnancy now resolved   .  Type o blood, rh negative     Past Gynecological History: G2 P1 Last pregnancy was at term 2 years ago. No LMP recorded. Patient is not currently having periods (Reason: Other). OCP  Family Hx:  Family History   Problem  Relation  Age of Onset   .  Hypertension  Father    .  Peripheral vascular disease  Father    .  Kidney disease  Father      nephrectomy   .  Cancer  Father      kidney   .  Heart disease  Paternal Aunt    .  Depression   Paternal Aunt    .  Cancer  Paternal Aunt      ovarian   .  Hypertension  Maternal Grandmother    .  Diabetes  Maternal Grandmother    .  Seizures  Maternal Grandmother      tia   .  Stroke  Maternal Grandmother    .  Cancer  Paternal Grandfather      prostate   .  Stroke  Maternal Grandfather     Review of Systems:  Constitutional  Feels well,  Pulmonary  No cough or hemoptysis  Gastro Intestinal  No nausea, vomitting, or diarrhoea. No bright red blood per rectum, no abdominal pain,  Genito Urinary  No vaginal bleeding or discharge LMP 07/2013 reports cramping after MTX.  Vitals: BP 108/76  Temp(Src) 98 F (36.7 C) (Oral)  Resp 16  Ht 5\' 5"  (1.651 m)  Wt 137 lb 12.8 oz (62.506 kg)  BMI 22.93 kg/m2 Wt Readings from Last 3 Encounters:  02/06/14 137 lb 12.8 oz (62.506 kg)  01/10/14 138 lb 11.2 oz (62.914 kg)  12/27/13 137 lb 4.8 oz (62.279 kg)  Body mass index is 22.93 kg/(m^2).   Physical Exam:  WD in NAD Good air movement.  Psychiatry  Alert and oriented, appropriate mood affect and speech.  Abdomen  Normoactive bowel sounds, abdomen soft, non-tender.  Back  No CVA tenderness  Extremities  No bilateral cyanosis, clubbing or edema.  Pelvic:  Nl EGBUS, no blood in the vaginal vault.

## 2014-02-06 NOTE — Telephone Encounter (Signed)
CT of chest per MD ordered notified pt appt on 02/13/14 at 3;15.pt lab appt moved to 3pm.Pt verbalized understanding confirmed appt date/times

## 2014-02-07 LAB — HCG, QUANTITATIVE, PREGNANCY: hCG, Beta Chain, Quant, S: <2 m[IU]/mL

## 2014-02-10 ENCOUNTER — Encounter: Payer: Self-pay | Admitting: Gynecologic Oncology

## 2014-02-10 NOTE — Progress Notes (Signed)
Patient's schedule for Hcg lab draws: once a month for three months, then every three months until 12/2014 per Dr. Skeet Latch.

## 2014-02-13 ENCOUNTER — Other Ambulatory Visit: Payer: Managed Care, Other (non HMO)

## 2014-02-13 ENCOUNTER — Ambulatory Visit (HOSPITAL_COMMUNITY)
Admission: RE | Admit: 2014-02-13 | Discharge: 2014-02-13 | Disposition: A | Discharge status: Home / Self Care | Payer: Managed Care, Other (non HMO) | Source: Ambulatory Visit | Attending: Gynecologic Oncology | Admitting: Gynecologic Oncology

## 2014-02-13 ENCOUNTER — Encounter (HOSPITAL_COMMUNITY): Payer: Self-pay

## 2014-02-13 DIAGNOSIS — R911 Solitary pulmonary nodule: Secondary | ICD-10-CM | POA: Insufficient documentation

## 2014-02-13 DIAGNOSIS — O019 Hydatidiform mole, unspecified: Secondary | ICD-10-CM

## 2014-02-13 MED ORDER — IOHEXOL 300 MG/ML  SOLN
80.0000 mL | Freq: Once | INTRAMUSCULAR | Status: AC | PRN
  Administered 2014-02-13: 80 mL via INTRAVENOUS

## 2014-02-25 DIAGNOSIS — R911 Solitary pulmonary nodule: Secondary | ICD-10-CM | POA: Insufficient documentation

## 2014-03-06 ENCOUNTER — Other Ambulatory Visit: Payer: Managed Care, Other (non HMO)

## 2014-03-06 DIAGNOSIS — O019 Hydatidiform mole, unspecified: Secondary | ICD-10-CM

## 2014-03-07 LAB — HCG, QUANTITATIVE, PREGNANCY: hCG, Beta Chain, Quant, S: <2 m[IU]/mL

## 2014-03-12 ENCOUNTER — Telehealth: Payer: Self-pay | Admitting: *Deleted

## 2014-03-12 NOTE — Telephone Encounter (Signed)
Notified pt BHCG <2, no further concerns.

## 2014-03-12 NOTE — Telephone Encounter (Signed)
Message copied by Lucile Crater on Wed Mar 12, 2014 11:29 AM ------      Message from: Gordy Levan      Created: Fri Mar 07, 2014  1:40 PM       Labs seen and need follow up: please let her know BHCG good at <2.0. I believe Dr Skeet Latch is ordering these labs now, so I may not see them promptly, but RN please be sure she gets called with each result   thanks       ------

## 2014-04-03 ENCOUNTER — Other Ambulatory Visit: Payer: Managed Care, Other (non HMO)

## 2014-04-03 DIAGNOSIS — O019 Hydatidiform mole, unspecified: Secondary | ICD-10-CM

## 2014-04-04 LAB — HCG, QUANTITATIVE, PREGNANCY: hCG, Beta Chain, Quant, S: <2 m[IU]/mL

## 2014-05-06 ENCOUNTER — Other Ambulatory Visit: Payer: Managed Care, Other (non HMO)

## 2014-05-06 DIAGNOSIS — O019 Hydatidiform mole, unspecified: Secondary | ICD-10-CM

## 2014-05-07 LAB — HCG, QUANTITATIVE, PREGNANCY: hCG, Beta Chain, Quant, S: <2 m[IU]/mL

## 2014-05-08 ENCOUNTER — Encounter: Payer: Self-pay | Admitting: Gynecologic Oncology

## 2014-05-08 ENCOUNTER — Ambulatory Visit: Payer: Managed Care, Other (non HMO) | Attending: Gynecologic Oncology | Admitting: Gynecologic Oncology

## 2014-05-08 VITALS — BP 109/74 | HR 69 | Temp 97.6°F | Resp 18 | Ht 65.0 in | Wt 132.7 lb

## 2014-05-08 DIAGNOSIS — R911 Solitary pulmonary nodule: Secondary | ICD-10-CM

## 2014-05-08 DIAGNOSIS — N912 Amenorrhea, unspecified: Secondary | ICD-10-CM | POA: Diagnosis not present

## 2014-05-08 DIAGNOSIS — O019 Hydatidiform mole, unspecified: Secondary | ICD-10-CM | POA: Diagnosis not present

## 2014-05-08 NOTE — Patient Instructions (Signed)
HCG check in 07/2014 and 10/2014  Follow-up 10/2014 CT chest 02/2015 Enjoy [place]!!!    Thank you very much Ms. Maille Halliwell for allowing me to provide care for you today.  I appreciate your confidence in choosing our Gynecologic Oncology team.  If you have any questions about your visit today please call our office and we will get back to you as soon as possible.  Please consider using the website Medlineplus.gov as an Geneticist, molecular.   Francetta Found. Dell Hurtubise MD., PhD Gynecologic Oncology

## 2014-05-08 NOTE — Progress Notes (Signed)
Office Note: Gyn-Onc   [employer] 43 y.o.   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 Significant findings are that her age is 2 years, the antecedent pregnancy was a live born infant at term 2 years ago, the highest pretreatment hCG is 52,590, the uterine size at the time of D&C was likely 8-10 cm given the fact that a #8 suction catheter was used.   WHO GTD score =1 (low risk, non metastatic)  She underwent repeat D&C. 11/20/13  Received one cycle MTX 01/02/2014. Desires to pursue fertility options. Advised to delay pregnancy until 07/12/2014 F/U in 3 months  Amenorrhea. LMP 07/2014 on OCP without breakthrough bleed.  Chest Nodule  A chest x-ray and CT of the abdomen and pelvis was notable for a 84mm pulmonary nodule. Repeat Chest CT 02/2014 stable.  Lynn Ibarra  reports radon exposure in childhood. Repeat Chest CT 02/2015 Pulmonology referral 02/2015 to determine risk and if lung cancer screening is necessary.   History of Present Illness:  Lynn Ibarra is a 43 y.o. gravida 2 para 1 last normal menstrual period 07/27/2013. On 09/27/2013 she underwent dilation and evacuation for presumed missed AB. The highest beta hCG prior to that procedure was 52,590 on 09/17/2013 . At the time of uterine evacuate evacuation the presence of chorionic villi was confirmed. 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  Subsequent beta hCGs are as follows  10/02/2014 301.4  10/11/2013 41.7  10/23/2013 25.94  10/30/2013 56.8  11/05/2013 74.8  11/13/2013 92.4  Lynn Ibarra's blood type is O- the last pregnancy was 2 years ago that resulted in a live born infant at term, she denies cough, shortness of breath, hemoptysis. She denies abdominal pain or vaginal bleeding. She is undecided regarding satisfaction of her parity. She received RhoGAM after the D&C.  Imaging studies without evidence of  mets.  WHO GTD score =1 (low risk, non metastatic)  She underwent repeat D&C. 11/20/13  11-20-2013 88  11-11-2013 61.8  12-22-2013 80.2  12-26-2013 69  12-27-2013 72.8  Discussion held with Dr and Mrs. Furches regarding intervention Actinomycin D vs MTX vs hys. The plan was for weekly treatment with MTX for two cycles past a normal.  HCG 12/27/2013 was 72.8   Cycle # 1 was administered on 01/02/2014 before HCG result was available.  The  HCG was collected that day,  prior to MTX returned 7.0.    Subsequent HCG all <2  Denies menses since 07/2013.   Would like to have more children.  Dr.Haygood aware of amenorrhea and is evaluating  Results for Lynn Ibarra, Lynn Ibarra (MRN [id number]) as of 05/08/2014 09:32  Ref. Range 03/06/2014 16:07 04/03/2014 16:09 05/06/2014 16:01  hCG, Beta Chain, Quant, S No range found <2.0 <2.0 <2.0   Chest Nodule  A chest x-ray and CT of the abdomen and pelvis was notable for a 32mm pulmonary nodule. Repeat Chest CT 02/2014 stable.  Lynn Ibarra  reports radon exposure as a child.    Past Surgical Hx:  Past Surgical History   Procedure  Laterality  Date   .  Colposcopy       x2   .  Lasik   2001   .  Eye surgery       lasic   .  Dilation and evacuation  N/A  09/20/2013     Procedure: DILATATION AND EVACUATION WITH FROZEN SECTION; Surgeon: Eldred Manges, MD; Location: [facility]; Service: Gynecology; Laterality: N/A;   .  Dilation and evacuation  N/A  11/20/2013     Procedure: DILATATION AND EVACUATION Under U/S Guidance; Surgeon: Eldred Manges, MD; Location: [place] [facility]; Service: Gynecology; Laterality: N/A;    Past Medical Hx:  Past Medical History   Diagnosis  Date   .  AMA (advanced maternal age) multigravida 44+    .  H/O candidiasis    .  Abnormal Pap smear  2010   .  Hx: UTI (urinary tract infection)      Occasionally   .  H/O bladder infections    .  H/O varicella    .  Yeast infection    .  H/O constipation  11/14/11   .  Pneumonia      childhood   .  GERD  (gastroesophageal reflux disease)      with pregnancy   .  Anemia  11/14/11     with pregnancy now resolved   .  Type o blood, rh negative     Past Gynecological History: G2 P1 Last pregnancy was at term 2 years ago. No LMP recorded. Patient is not currently having periods (Reason: Other). OCP  Family Hx:  Family History   Problem  Relation  Age of Onset   .  Hypertension  Father    .  Peripheral vascular disease  Father    .  Kidney disease  Father      nephrectomy   .  Cancer  Father      kidney   .  Heart disease  Paternal Aunt    .  Depression  Paternal Aunt    .  Cancer  Paternal Aunt      ovarian   .  Hypertension  Maternal Grandmother    .  Diabetes  Maternal Grandmother    .  Seizures  Maternal Grandmother      tia   .  Stroke  Maternal Grandmother    .  Cancer  Paternal Grandfather      prostate   .  Stroke  Maternal Grandfather     Social History:   Visiting [place] this week  Review of Systems:  Constitutional  Feels well,  CV No chest pain Pulmonary  No cough or hemoptysis  Gastro Intestinal  No nausea, vomitting, or diarrhoea. No bright red blood per rectum, no abdominal pain,  Genito Urinary  No vaginal bleeding or discharge LMP 07/2013    Vitals: BP 109/74  Pulse 69  Temp(Src) 97.6 F (36.4 C) (Oral)  Resp 18  Ht 5\' 5"  (1.651 m)  Wt 132 lb 11.2 oz (60.192 kg)  BMI 22.08 kg/m2 Wt Readings from Last 3 Encounters:  05/08/14 132 lb 11.2 oz (60.192 kg)  02/06/14 137 lb 12.8 oz (62.506 kg)  01/10/14 138 lb 11.2 oz (62.914 kg)  Body mass index is 22.08 kg/(m^2).   Physical Exam:  WD in NAD Psychiatry  Alert and oriented, appropriate mood affect and speech.  Chest:  CTA  Good air movement. Abdomen  Normoactive bowel sounds, abdomen soft, non-tender.  Back  No CVA tenderness  Extremities  No bilateral cyanosis, clubbing or edema.  Pelvic:  Nl EGBUS, no blood in the vaginal vault. Normal cervix, no vaginal lesions

## 2014-07-31 ENCOUNTER — Telehealth: Payer: Self-pay | Admitting: Oncology

## 2014-08-07 ENCOUNTER — Other Ambulatory Visit: Payer: Managed Care, Other (non HMO)

## 2014-08-14 ENCOUNTER — Other Ambulatory Visit (HOSPITAL_BASED_OUTPATIENT_CLINIC_OR_DEPARTMENT_OTHER): Payer: Managed Care, Other (non HMO)

## 2014-08-14 DIAGNOSIS — O019 Hydatidiform mole, unspecified: Secondary | ICD-10-CM

## 2014-08-14 DIAGNOSIS — D392 Neoplasm of uncertain behavior of placenta: Secondary | ICD-10-CM

## 2014-08-15 LAB — HCG, QUANTITATIVE, PREGNANCY: hCG, Beta Chain, Quant, S: <2 m[IU]/mL

## 2014-08-18 ENCOUNTER — Encounter: Payer: Self-pay | Admitting: Gynecologic Oncology

## 2014-10-28 HISTORY — PX: BREAST ENHANCEMENT SURGERY: SHX7

## 2014-11-06 ENCOUNTER — Other Ambulatory Visit (HOSPITAL_BASED_OUTPATIENT_CLINIC_OR_DEPARTMENT_OTHER): Payer: Managed Care, Other (non HMO)

## 2014-11-06 DIAGNOSIS — O010 Classical hydatidiform mole: Secondary | ICD-10-CM

## 2014-11-06 DIAGNOSIS — O019 Hydatidiform mole, unspecified: Secondary | ICD-10-CM

## 2014-11-07 LAB — HCG, QUANTITATIVE, PREGNANCY: hCG, Beta Chain, Quant, S: <2 m[IU]/mL

## 2014-12-17 ENCOUNTER — Telehealth: Payer: Self-pay | Admitting: *Deleted

## 2014-12-17 NOTE — Telephone Encounter (Signed)
Patient called requesting an appt with Dr. Skeet Latch. Offered patient MD visit the week of March 28th. Patient states she will be out of town then. Offered for patient to see Dr. Denman George instead. Patient declined and states "It is not urgent. I was suppose to see Dr. Skeet Latch in January and totally forgot." Gave patient appt for 02/26/15 at 10:30am. Told patient if anything changes and see needs to be seen sooner to please call us back - patient agreeable to this.

## 2015-02-26 ENCOUNTER — Other Ambulatory Visit (HOSPITAL_BASED_OUTPATIENT_CLINIC_OR_DEPARTMENT_OTHER): Payer: Managed Care, Other (non HMO)

## 2015-02-26 ENCOUNTER — Encounter: Payer: Self-pay | Admitting: Gynecologic Oncology

## 2015-02-26 ENCOUNTER — Ambulatory Visit: Payer: Managed Care, Other (non HMO) | Attending: Gynecologic Oncology | Admitting: Gynecologic Oncology

## 2015-02-26 VITALS — BP 110/70 | HR 69 | Temp 98.1°F | Resp 18 | Ht 65.0 in | Wt 136.5 lb

## 2015-02-26 DIAGNOSIS — Z77123 Contact with and (suspected) exposure to radon and other naturally occuring radiation: Secondary | ICD-10-CM | POA: Diagnosis not present

## 2015-02-26 DIAGNOSIS — R948 Abnormal results of function studies of other organs and systems: Secondary | ICD-10-CM | POA: Diagnosis not present

## 2015-02-26 DIAGNOSIS — O010 Classical hydatidiform mole: Secondary | ICD-10-CM

## 2015-02-26 DIAGNOSIS — O019 Hydatidiform mole, unspecified: Secondary | ICD-10-CM

## 2015-02-26 DIAGNOSIS — Z9221 Personal history of antineoplastic chemotherapy: Secondary | ICD-10-CM

## 2015-02-26 DIAGNOSIS — R911 Solitary pulmonary nodule: Secondary | ICD-10-CM | POA: Insufficient documentation

## 2015-02-26 NOTE — Progress Notes (Signed)
Office Note: Gyn-Onc   Lynn Ibarra 44 y.o.   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 Significant findings are that her age is 58 years, the antecedent pregnancy was a live born infant at term 2 years ago, the highest pretreatment hCG is 52,590, the uterine size at the time of D&C was likely 8-10 cm given the fact that a #8 suction catheter was used.   WHO GTD score =1 (low risk, non metastatic)  She underwent repeat D&C. 11/20/13  Received one cycle MTX 01/02/2014. Check serum HCG today   Chest Nodule  A chest x-ray and CT of the abdomen and pelvis was notable for a 51mm pulmonary nodule. Repeat Chest CT 02/2014 stable.  Lynn Ibarra  reports radon exposure in childhood. Repeat Chest CT 02/2015 Pulmonology referral 02/2015 to determine risk and if lung cancer screening is necessary.   History of Present Illness:  Lynn Ibarra is a 44 y.o. gravida 2 para 1 last normal menstrual period 07/27/2013. On 09/27/2013 she underwent dilation and evacuation for presumed missed AB. The highest beta hCG prior to that procedure was 52,590 on 09/17/2013 . At the time of uterine evacuate evacuation the presence of chorionic villi was confirmed. 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  Subsequent beta hCGs are as follows  10/02/2014 301.4  10/11/2013 41.7  10/23/2013 25.94  10/30/2013 56.8  11/05/2013 74.8  11/13/2013 92.4  Lynn Ibarra's blood type is O- the last pregnancy was 2 years ago that resulted in a live born infant at term, she denies cough, shortness of breath, hemoptysis. She denies abdominal pain or vaginal bleeding. She is undecided regarding satisfaction of her parity. She received RhoGAM after the D&C.  Imaging studies without evidence of mets.  WHO GTD score =1 (low risk, non metastatic)  She underwent repeat D&C. 11/20/13  11-20-2013 88  11-11-2013 61.8  12-22-2013 80.2   12-26-2013 69  12-27-2013 72.8  Discussion held with Dr and Lynn Ibarra regarding intervention Actinomycin D vs MTX vs hys. The plan was for weekly treatment with MTX for two cycles past a normal.  HCG 12/27/2013 was 72.8   Cycle # 1 was administered on 01/02/2014 before HCG result was available.  The  HCG was collected that day,  prior to MTX returned 7.0.    Subsequent HCG all <2  Currently on OCP's   Results for Lynn Ibarra, Lynn Ibarra (MRN [id number]) as of 05/08/2014 09:32  Ref. Range 03/06/2014 16:07 04/03/2014 16:09 05/06/2014 16:01  hCG, Beta Chain, Quant, S No range found <2.0 <2.0 <2.0   Chest Nodule  A chest x-ray and CT of the abdomen and pelvis was notable for a 22mm pulmonary nodule. Repeat Chest CT 02/2014 stable.  Lynn Ibarra  reports radon exposure as a child.    Past Surgical Hx:  Past Surgical History   Procedure  Laterality  Date   .  Colposcopy       x2   .  Lasik   2001   .  Eye surgery       lasic   .  Dilation and evacuation  N/A  09/20/2013     Procedure: DILATATION AND EVACUATION WITH FROZEN SECTION; Surgeon: Eldred Manges, MD; Location: [place] [facility]; Service: Gynecology; Laterality: N/A;   .  Dilation and evacuation  N/A  11/20/2013     Procedure: DILATATION AND EVACUATION Under U/S Guidance; Surgeon: Eldred Manges, MD; Location: [place] [facility];  Service: Gynecology; Laterality: N/A;    Past Medical Hx:  Past Medical History   Diagnosis  Date   .  AMA (advanced maternal age) multigravida 22+    .  H/O candidiasis    .  Abnormal Pap smear  2010   .  Hx: UTI (urinary tract infection)      Occasionally   .  H/O bladder infections    .  H/O varicella    .  Yeast infection    .  H/O constipation  11/14/11   .  Pneumonia      childhood   .  GERD (gastroesophageal reflux disease)      with pregnancy   .  Anemia  11/14/11     with pregnancy now resolved   .  Type o blood, rh negative     Past Gynecological History: G2 P1 Last pregnancy was at term 2 years ago. No LMP  recorded. Patient is not currently having periods (Reason: Other). OCP  Family Hx:  Family History   Problem  Relation  Age of Onset   .  Hypertension  Father    .  Peripheral vascular disease  Father    .  Kidney disease  Father      nephrectomy   .  Cancer  Father      kidney   .  Heart disease  Paternal Aunt    .  Depression  Paternal Aunt    .  Cancer  Paternal Aunt      ovarian   .  Hypertension  Maternal Grandmother    .  Diabetes  Maternal Grandmother    .  Seizures  Maternal Grandmother      tia   .  Stroke  Maternal Grandmother    .  Cancer  Paternal Grandfather      prostate   .  Stroke  Maternal Grandfather     Social History:   No plans for future childbearing  Review of Systems:  Constitutional  Feels well,  CV No chest pain Pulmonary  No cough or hemoptysis  Gastro Intestinal  No nausea, vomitting, or diarrhoea. No bright red blood per rectum, no abdominal pain,  Genito Urinary  No vaginal bleeding or discharge LMP 3 weeks ago.  On OCPs  Vitals: BP 110/70 mmHg  Pulse 69  Temp(Src) 98.1 F (36.7 C) (Oral)  Resp 18  Ht 5\' 5"  (1.651 m)  Wt 136 lb 8 oz (61.916 kg)  BMI 22.71 kg/m2 Wt Readings from Last 3 Encounters:  02/26/15 136 lb 8 oz (61.916 kg)  05/08/14 132 lb 11.2 oz (60.192 kg)  02/06/14 137 lb 12.8 oz (62.506 kg)  Body mass index is 22.71 kg/(m^2).   Physical Exam:  WD in NAD Psychiatry  Alert and oriented, appropriate mood affect and speech.  Chest:  CTA  Good air movement. Abdomen  Normoactive bowel sounds, abdomen soft, non-tender.  Back  No CVA tenderness  Extremities  No bilateral cyanosis, clubbing or edema.  Pelvic:  Nl EGBUS, no blood in the vaginal vault. Normal cervix, no vaginal lesions

## 2015-02-26 NOTE — Patient Instructions (Addendum)
We will contact you with the results of your HCG from today.  Plan to follow up with Dr. Leo Grosser and Dr. Skeet Latch as needed.  Thank you very much [name] for allowing me to provide care for you today.  I appreciate your confidence in choosing our Gynecologic Oncology team.  If you have any questions about your visit today please call our office and we will get back to you as soon as possible.  Please consider using the website Medlineplus.gov as an Geneticist, molecular.   Francetta Found. Shawnetta Lein MD., PhD Gynecologic Oncology

## 2015-02-27 LAB — HCG, QUANTITATIVE, PREGNANCY: hCG, Beta Chain, Quant, S: <2 m[IU]/mL

## 2015-03-02 ENCOUNTER — Telehealth: Payer: Self-pay | Admitting: *Deleted

## 2015-03-02 NOTE — Telephone Encounter (Signed)
Per Joylene John, NP patient notified of normal hCG level from 02/26/15.  While on the phone, patient reminded of CT scan on 03/05/15 with a 3:15pm arrival time at [facility]. Patient agreeable to this appt.

## 2015-03-05 ENCOUNTER — Ambulatory Visit (HOSPITAL_COMMUNITY)
Admission: RE | Admit: 2015-03-05 | Discharge: 2015-03-05 | Disposition: A | Discharge status: Home / Self Care | Payer: Managed Care, Other (non HMO) | Source: Ambulatory Visit | Attending: Gynecologic Oncology | Admitting: Gynecologic Oncology

## 2015-03-05 DIAGNOSIS — O019 Hydatidiform mole, unspecified: Secondary | ICD-10-CM

## 2015-03-05 DIAGNOSIS — R918 Other nonspecific abnormal finding of lung field: Secondary | ICD-10-CM | POA: Insufficient documentation

## 2015-03-05 DIAGNOSIS — O010 Classical hydatidiform mole: Secondary | ICD-10-CM | POA: Diagnosis present

## 2015-03-05 MED ORDER — IOHEXOL 300 MG/ML  SOLN
80.0000 mL | Freq: Once | INTRAMUSCULAR | Status: AC | PRN
  Administered 2015-03-05: 80 mL via INTRAVENOUS

## 2015-03-09 ENCOUNTER — Telehealth: Payer: Self-pay | Admitting: Nurse Practitioner

## 2015-03-09 NOTE — Telephone Encounter (Signed)
Per Joylene John, NP, patient informed lung nodules are stable and slightly smaller. Per MD note, we will follow up on as needed basis. Patient verbalizes understanding and thanks for results.

## 2015-11-25 IMAGING — CT CT CHEST W/ CM
2 of 4 series · 15 of 36 positions shown, 18 images · IV contrast (OMNIPAQUE)
Comparison: 11/15/2013

CLINICAL DATA: Followup pulmonary nodule

EXAM:
CT CHEST WITH CONTRAST
TECHNIQUE: Multidetector CT imaging of the chest was performed during
intravenous contrast administration.
CONTRAST:  80mL OMNIPAQUE IOHEXOL 300 MG/ML  SOLN

[Series 2: chest with st · axial · 0.70mm/px · z∈[-364,-88]mm · 12 of 65 slices shown, 15 images]
[im 5/65  mediastinal]
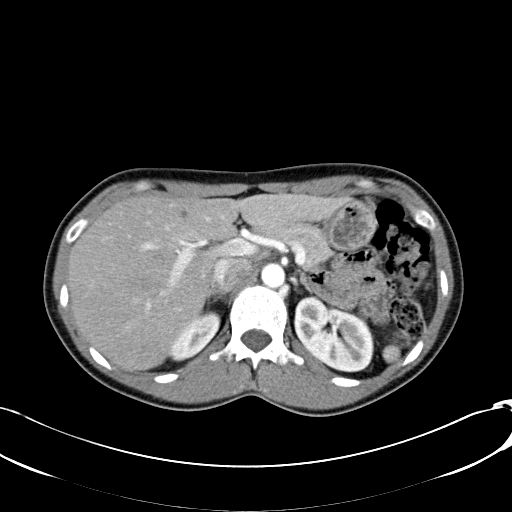
[im 5/65  lung]
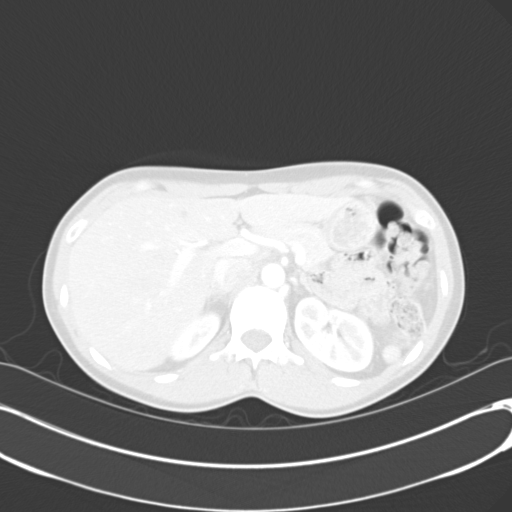
[im 10/65  lung]
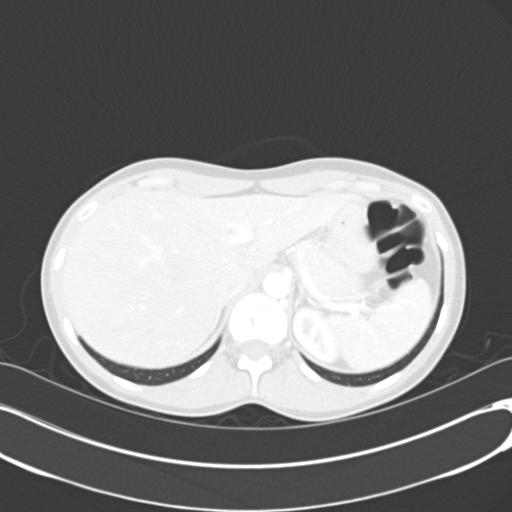
[im 14/65  lung]
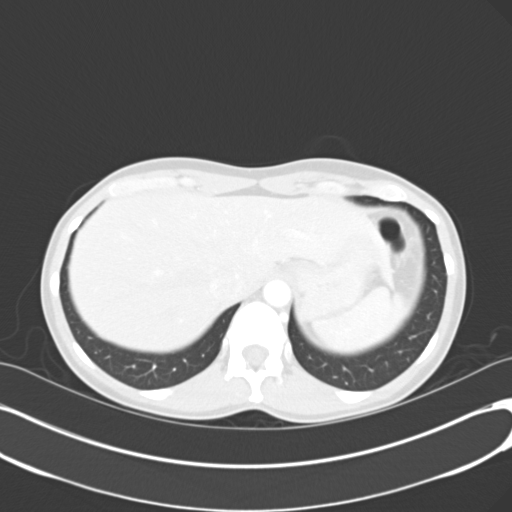
[im 19/65  lung]
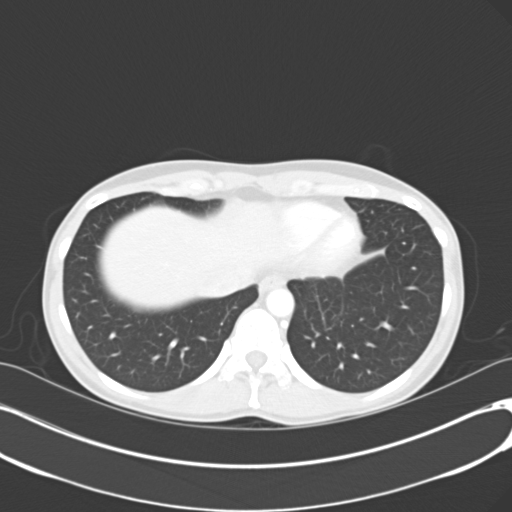
[im 23/65  mediastinal]
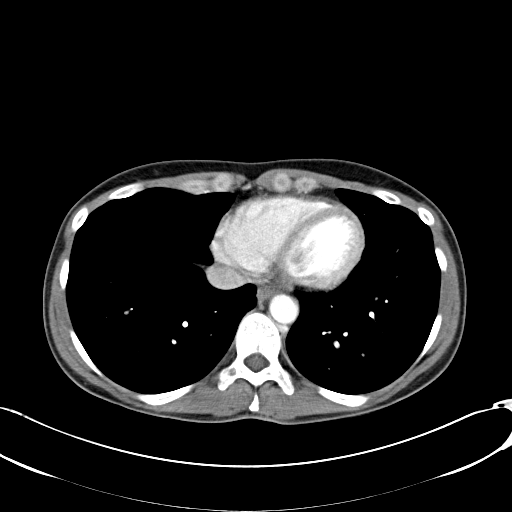
[im 23/65  lung]
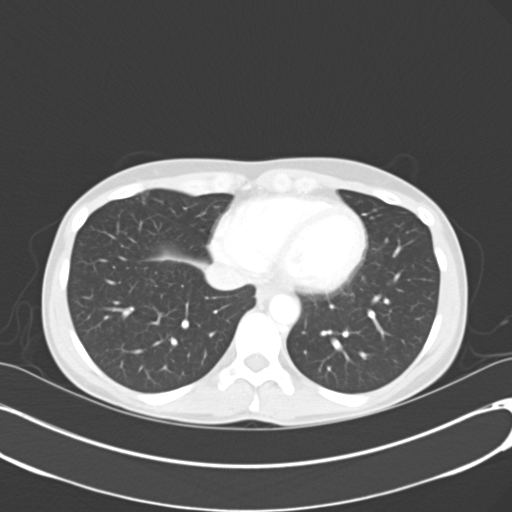
[im 28/65  lung]
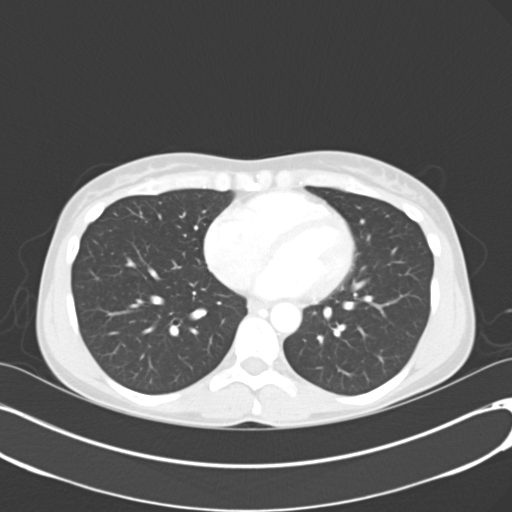
[im 37/65  lung]
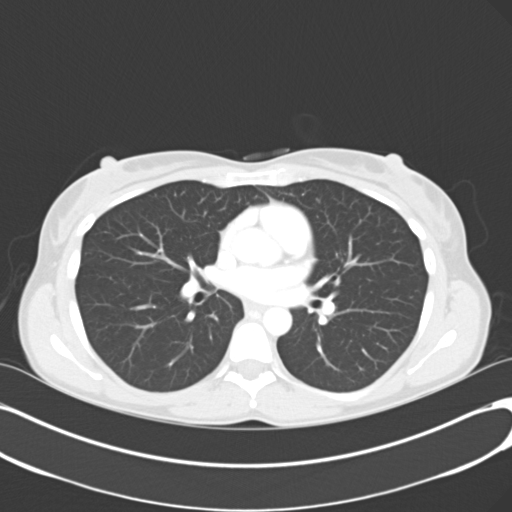
[im 42/65  lung]
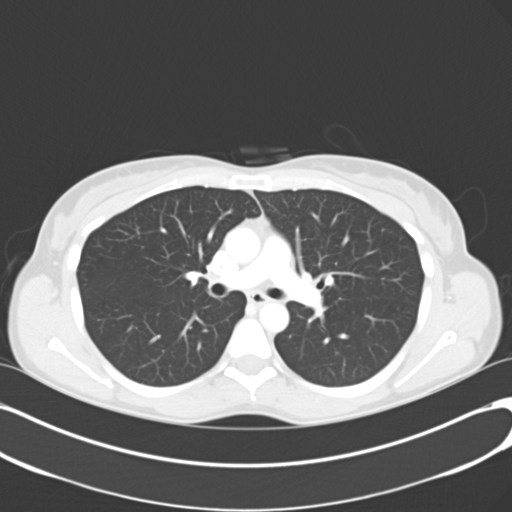
[im 46/65  mediastinal]
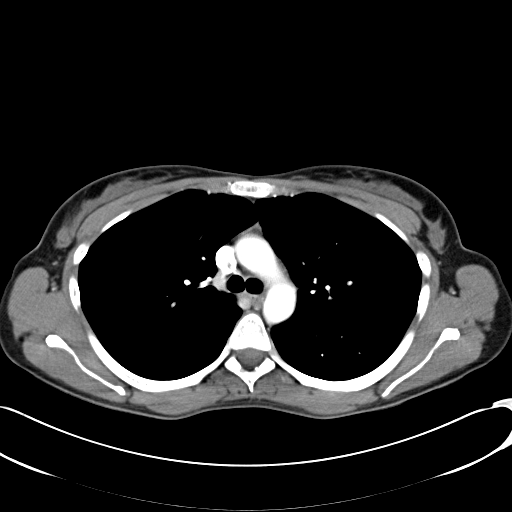
[im 46/65  lung]
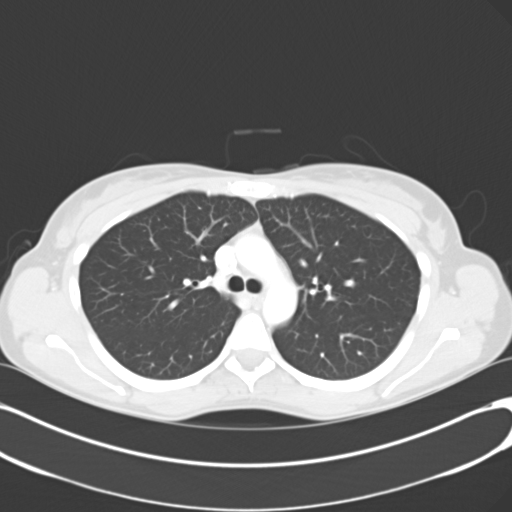
[im 51/65  lung]
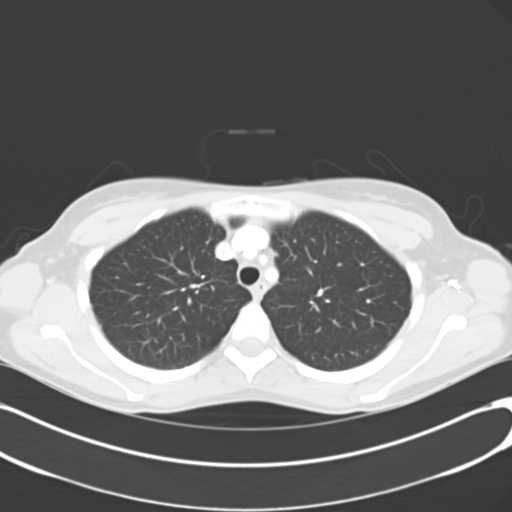
[im 55/65  lung]
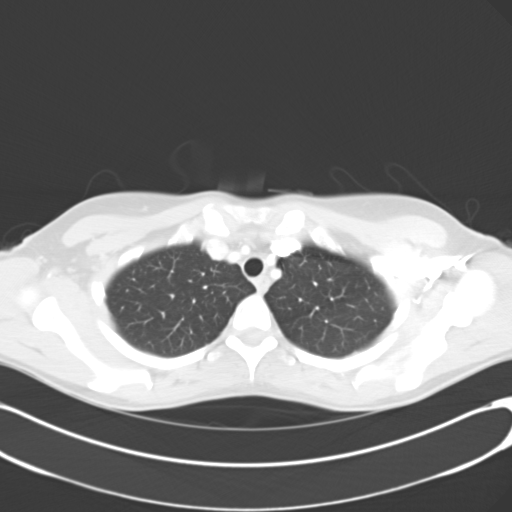
[im 60/65  lung]
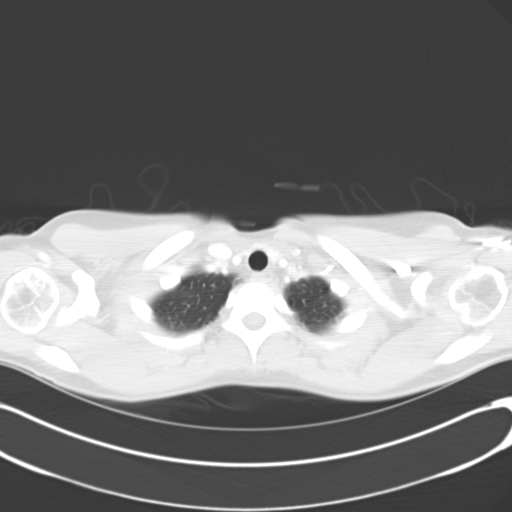

[Series 602: <mpr thick range> · coronal · 0.70mm/px · 3 of 65 slices shown]
[im 13/65  lung]
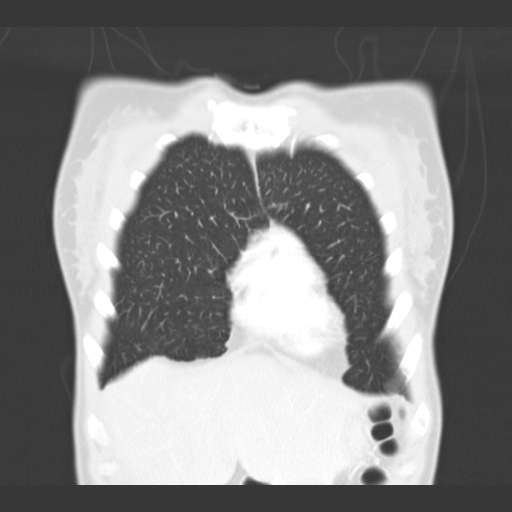
[im 26/65  lung]
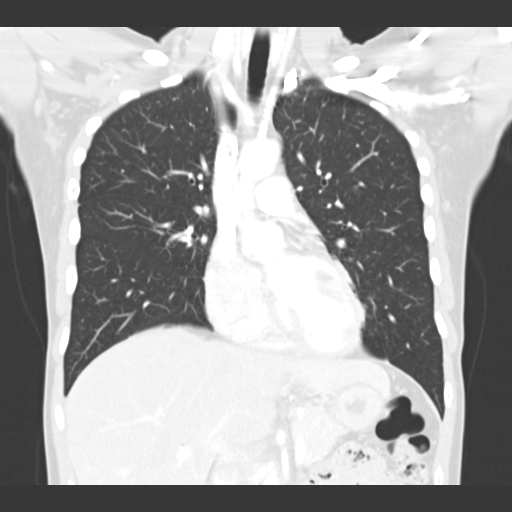
[im 39/65  lung]
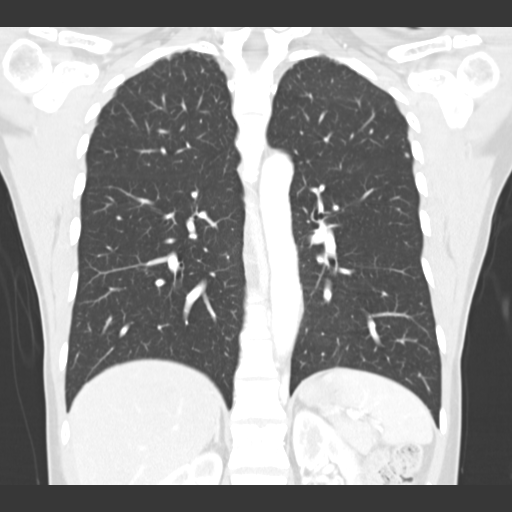

[15 of 36 positions shown; findings below may reference images not displayed]

FINDINGS: The heart size is normal. No pericardial effusion. There is no
enlarged mediastinal or hilar lymph nodes identified. No pericardial
effusion identified. The trachea appears patent and is midline.
There are no enlarged axillary or supraclavicular lymph nodes.

No pleural effusion noted. 4 mm left upper lobe nodule is
identified, image 19/series 5. In the right middle lobe there is a 4
mm nodule, image 34/series 5.

Review of the visualized bony structures is negative for aggressive
lytic or sclerotic bone lesion. Incidental imaging through the upper
abdomen shows a 4 mm low attenuation structure in the lateral
segment of left hepatic lobe. 5 mm hypodensity within the medial
segment of the left hepatic lobe is identified 6 mm low attenuation,
image 60/series 2. These are both too small to reliably characterize
but likely represent small cysts.

Review of the visualized osseous structures is negative for lytic or
sclerotic bone lesion.
IMPRESSION: 1. Two small nodules are identified within the left upper lobe and
right middle lobe. These measure up to 4 mm. If the patient is at
high risk for bronchogenic carcinoma, follow-up chest CT at 1 year
is recommended. If the patient is at low risk, no follow-up is
needed. This recommendation follows the consensus statement:
Guidelines for Management of Small Pulmonary Nodules Detected on CT
Scans: A Statement from the [HOSPITAL] as published in

## 2016-01-05 ENCOUNTER — Telehealth: Payer: Self-pay | Admitting: Gynecologic Oncology

## 2016-01-05 NOTE — Telephone Encounter (Signed)
Left message for the patient.  Dr. Leo Grosser had wanted Dr. Skeet Latch to weight in on whether the patient would need follow CT imaging.  Per Dr. Skeet Latch: Two years without change she should be fine. Get her PCP to weigh in please.   Left the above information on a message for the patient and advised her to please call the office for any questions or concerns.  Also, called and left the above information for Dr. Leo Grosser at her office.

## 2016-10-01 ENCOUNTER — Other Ambulatory Visit: Payer: Self-pay | Admitting: Nurse Practitioner

## 2018-11-22 ENCOUNTER — Telehealth: Payer: Self-pay | Admitting: Gynecologic Oncology

## 2018-11-22 NOTE — Telephone Encounter (Signed)
Faxed records to Dr. Ernie Hew [phone]

## 2019-06-10 ENCOUNTER — Ambulatory Visit (INDEPENDENT_AMBULATORY_CARE_PROVIDER_SITE_OTHER): Payer: Managed Care, Other (non HMO) | Admitting: Orthopaedic Surgery

## 2019-06-10 ENCOUNTER — Ambulatory Visit (INDEPENDENT_AMBULATORY_CARE_PROVIDER_SITE_OTHER): Payer: Managed Care, Other (non HMO)

## 2019-06-10 DIAGNOSIS — M25511 Pain in right shoulder: Secondary | ICD-10-CM

## 2019-06-10 MED ORDER — LIDOCAINE HCL 1 % IJ SOLN
3.0000 mL | INTRAMUSCULAR | Status: AC | PRN
  Administered 2019-06-10: 15:00:00 3 mL

## 2019-06-10 MED ORDER — METHYLPREDNISOLONE ACETATE 40 MG/ML IJ SUSP
40.0000 mg | INTRAMUSCULAR | Status: AC | PRN
  Administered 2019-06-10: 15:00:00 40 mg via INTRA_ARTICULAR

## 2019-06-10 NOTE — Progress Notes (Signed)
Office Visit Note   Patient: Lynn Ibarra           Date of Birth: Mar 18, 1971           MRN: [id number] Visit Date: 06/10/2019              Requested by: No referring provider defined for this encounter. PCP: Patient, No Pcp Per   Assessment & Plan: Visit Diagnoses:  1. Right shoulder pain, unspecified chronicity     Plan: I do feel that she would benefit from guided physical therapy on her shoulder that can help with the trigger point pain and the pain of the subacromial outlet.  I do feel is worthwhile trying another steroid injection in the subacromial outlet and she tolerated this well.  I have also recommended over-the-counter Voltaren gel to use in the trapezius area and off the lateral edge of her shoulder.  All question concerns were answered and addressed.  I have given her a prescription for physical therapy.  I would like to see her back in 4 weeks to see how she is doing overall.  Follow-Up Instructions: Return in about 4 weeks (around 07/08/2019).   Orders:  Orders Placed This Encounter  Procedures  . Large Joint Inj  . XR Shoulder Right   No orders of the defined types were placed in this encounter.     Procedures: Large Joint Inj: R subacromial bursa on 06/10/2019 3:09 PM Indications: pain and diagnostic evaluation Details: 22 G 1.5 in needle  Arthrogram: No  Medications: 3 mL lidocaine 1 %; 40 mg methylPREDNISolone acetate 40 MG/ML Outcome: tolerated well, no immediate complications Procedure, treatment alternatives, risks and benefits explained, specific risks discussed. Consent was given by the patient. Immediately prior to procedure a time out was called to verify the correct patient, procedure, equipment, support staff and site/side  marked as required. Patient was prepped and draped in the usual sterile fashion.       Clinical Data: No additional findings.   Subjective: Chief Complaint  Patient presents with  . 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  I actually have provided at least 1 steroid injection in the subacromial outlet several months ago and this helped for a while.  She still been experiencing shoulder pain and she points the subacromial outlet the source of her pain but she is also had some pain in the trigger point areas around the trapezius and parascapular area.  She denies any neck pain and denies any numbness and tingling in her hand.  She says she feels like she has some decreased motion and decreased strength.  It was about 8 months ago when she started feeling this when she was going to the gym working out.  She did have a steroid  taper about a week ago.  HPI  Review of Systems She currently denies any headache, chest pain, shortness of breath, fever, chills, nausea, vomiting  Objective: Vital Signs: There were no vitals taken for this visit.  Physical Exam She is alert and orient x3 and in no acute distress Ortho Exam Examination of her right shoulder shows fluid and full range of motion.  She has 5 out of 5 strength of the rotator cuff.  She does show some minimal signs of impingement of her shoulder and some pain in the trapezius area suggesting trigger point pain.  The remainder of her upper extremity exam is normal. Specialty Comments:  No specialty comments available.  Imaging: Xr Shoulder Right  Result Date: 06/10/2019 3 views of the right shoulder show well located shoulder.  There is excellent space of the glenohumeral joint and the AC joint.  There is no decrease in her subacromial outlet.    PMFS History: Patient Active Problem List   Diagnosis Date Noted  . Incidental pulmonary nodule 02/25/2014  .  Gestational trophoblastic neoplasm 12/27/2013  . Advanced maternal age (AMA), 40 years or greater 11/20/2013  . Type O blood, Rh negative   . GTD (gestational trophoblastic disease) 09/27/2013  . Abortion, missed 09/19/2013  . Travel advice encounter 05/06/2013  . Hemorrhoids, external without complications [id number]   Past Medical History:  Diagnosis Date  . Abnormal Pap smear 2010  . AMA (advanced maternal age) multigravida 17+   . Anemia 11/14/11   with pregnancy now resolved  . GERD (gastroesophageal reflux disease)    with pregnancy  . H/O bladder infections   . H/O candidiasis   . H/O constipation 11/14/11  . H/O varicella   . Hx: UTI (urinary tract infection)    Occasionally  . Pneumonia    childhood  . Type o blood, rh negative   . Yeast infection     Family History  Problem Relation Age of Onset  . Hypertension Father   . Peripheral vascular disease Father   . Kidney disease Father        nephrectomy  . Cancer Father        kidney  . Heart disease Paternal Aunt   . Depression Paternal Aunt   . Cancer Paternal Aunt        ovarian  . Hypertension Maternal Grandmother   . Diabetes Maternal Grandmother   . Seizures Maternal Grandmother        tia  . Stroke Maternal Grandmother   . Cancer Paternal Grandfather        prostate  . Stroke Maternal Grandfather     Past Surgical History:  Procedure Laterality Date  . BREAST ENHANCEMENT SURGERY  10/28/2014  . COLPOSCOPY     x2  . DILATION AND EVACUATION N/A 09/20/2013   Procedure: DILATATION AND EVACUATION WITH FROZEN SECTION;  Surgeon: Eldred Manges, MD;  Location: [facility];  Service: Gynecology;  Laterality: N/A;  . DILATION AND EVACUATION N/A 11/20/2013   Procedure: DILATATION AND EVACUATION Under U/S Guidance;  Surgeon: Eldred Manges, MD;  Location: [place];  Service: Gynecology;  Laterality: N/A;  . EYE SURGERY     lasic  . LASIK  2001   Social History   Occupational History  . Not on file  Tobacco  Use  . Smoking status: Never Smoker  . Smokeless tobacco: Never Used  Substance and Sexual Activity  . Alcohol use: Yes    Comment: socially  . Drug use:  No  . Sexual activity: Yes

## 2019-07-08 ENCOUNTER — Ambulatory Visit (INDEPENDENT_AMBULATORY_CARE_PROVIDER_SITE_OTHER): Payer: Managed Care, Other (non HMO) | Admitting: Orthopaedic Surgery

## 2019-07-08 ENCOUNTER — Encounter: Payer: Self-pay | Admitting: Orthopaedic Surgery

## 2019-07-08 ENCOUNTER — Other Ambulatory Visit: Payer: Self-pay

## 2019-07-08 VITALS — Ht 65.0 in | Wt 155.0 lb

## 2019-07-08 DIAGNOSIS — G8929 Other chronic pain: Secondary | ICD-10-CM

## 2019-07-08 DIAGNOSIS — M25511 Pain in right shoulder: Secondary | ICD-10-CM | POA: Diagnosis not present

## 2019-07-08 NOTE — Progress Notes (Signed)
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  She is a very active 48 year old female who has been dealing with right shoulder pain for some time now.  Some of this seem to be more in the trapezius and parascapular area it was definitely trigger point related.  I did provide injection and trigger point her last visit and she has been going to outpatient physical therapy.  She does feel that she is slowly getting better with dry needling.  She has a therapy program set up and she is going there later today and can continue therapy.  Her right shoulder itself has not been an issue recently.  She has excellent range of motion of her right shoulder and is strong.  She has less pain in the trapezius and parascapular area as well.  I agree with her continuing therapy and at this point with me she can follow-up as needed however if things worsen anyway or she has any other issues she knows to give me a call.

## 2019-10-07 ENCOUNTER — Ambulatory Visit: Payer: Managed Care, Other (non HMO) | Attending: Internal Medicine

## 2019-10-07 DIAGNOSIS — Z20822 Contact with and (suspected) exposure to covid-19: Secondary | ICD-10-CM

## 2019-10-08 LAB — NOVEL CORONAVIRUS, NAA: SARS-CoV-2, NAA: NOT DETECTED

## 2019-11-19 ENCOUNTER — Ambulatory Visit: Payer: Managed Care, Other (non HMO) | Attending: Internal Medicine

## 2019-11-19 DIAGNOSIS — Z20822 Contact with and (suspected) exposure to covid-19: Secondary | ICD-10-CM

## 2019-11-20 LAB — NOVEL CORONAVIRUS, NAA: SARS-CoV-2, NAA: NOT DETECTED

## 2020-01-01 ENCOUNTER — Other Ambulatory Visit: Payer: Self-pay | Admitting: Gastroenterology

## 2020-01-01 DIAGNOSIS — K639 Disease of intestine, unspecified: Secondary | ICD-10-CM

## 2020-01-01 DIAGNOSIS — R933 Abnormal findings on diagnostic imaging of other parts of digestive tract: Secondary | ICD-10-CM

## 2020-01-15 ENCOUNTER — Ambulatory Visit
Admission: RE | Admit: 2020-01-15 | Discharge: 2020-01-15 | Disposition: A | Discharge status: Home / Self Care | Payer: 59 | Source: Ambulatory Visit | Attending: Gastroenterology | Admitting: Gastroenterology

## 2020-01-15 DIAGNOSIS — R933 Abnormal findings on diagnostic imaging of other parts of digestive tract: Secondary | ICD-10-CM

## 2020-01-15 MED ORDER — IOPAMIDOL (ISOVUE-300) INJECTION 61%
100.0000 mL | Freq: Once | INTRAVENOUS | Status: AC | PRN
  Administered 2020-01-15: 100 mL via INTRAVENOUS

## 2022-03-04 ENCOUNTER — Other Ambulatory Visit: Payer: Self-pay | Admitting: Family Medicine

## 2022-03-04 DIAGNOSIS — Z8249 Family history of ischemic heart disease and other diseases of the circulatory system: Secondary | ICD-10-CM

## 2022-04-18 ENCOUNTER — Ambulatory Visit
Admission: RE | Admit: 2022-04-18 | Discharge: 2022-04-18 | Disposition: A | Discharge status: Home / Self Care | Payer: No Typology Code available for payment source | Source: Ambulatory Visit | Attending: Family Medicine | Admitting: Family Medicine

## 2022-04-18 DIAGNOSIS — Z8249 Family history of ischemic heart disease and other diseases of the circulatory system: Secondary | ICD-10-CM

## 2023-10-19 DIAGNOSIS — J3089 Other allergic rhinitis: Secondary | ICD-10-CM | POA: Diagnosis not present

## 2023-10-19 DIAGNOSIS — J3081 Allergic rhinitis due to animal (cat) (dog) hair and dander: Secondary | ICD-10-CM | POA: Diagnosis not present

## 2023-10-19 DIAGNOSIS — J301 Allergic rhinitis due to pollen: Secondary | ICD-10-CM | POA: Diagnosis not present

## 2023-10-24 DIAGNOSIS — J3089 Other allergic rhinitis: Secondary | ICD-10-CM | POA: Diagnosis not present

## 2023-10-24 DIAGNOSIS — J3081 Allergic rhinitis due to animal (cat) (dog) hair and dander: Secondary | ICD-10-CM | POA: Diagnosis not present

## 2023-10-24 DIAGNOSIS — J301 Allergic rhinitis due to pollen: Secondary | ICD-10-CM | POA: Diagnosis not present

## 2023-10-26 DIAGNOSIS — E559 Vitamin D deficiency, unspecified: Secondary | ICD-10-CM | POA: Diagnosis not present

## 2023-10-26 DIAGNOSIS — R5383 Other fatigue: Secondary | ICD-10-CM | POA: Diagnosis not present

## 2023-10-31 DIAGNOSIS — Z7189 Other specified counseling: Secondary | ICD-10-CM | POA: Diagnosis not present

## 2023-10-31 DIAGNOSIS — K5900 Constipation, unspecified: Secondary | ICD-10-CM | POA: Diagnosis not present

## 2023-10-31 DIAGNOSIS — J301 Allergic rhinitis due to pollen: Secondary | ICD-10-CM | POA: Diagnosis not present

## 2023-10-31 DIAGNOSIS — K644 Residual hemorrhoidal skin tags: Secondary | ICD-10-CM | POA: Diagnosis not present

## 2023-10-31 DIAGNOSIS — N951 Menopausal and female climacteric states: Secondary | ICD-10-CM | POA: Diagnosis not present

## 2023-10-31 DIAGNOSIS — J3089 Other allergic rhinitis: Secondary | ICD-10-CM | POA: Diagnosis not present

## 2023-10-31 DIAGNOSIS — J3081 Allergic rhinitis due to animal (cat) (dog) hair and dander: Secondary | ICD-10-CM | POA: Diagnosis not present

## 2023-11-06 DIAGNOSIS — J3081 Allergic rhinitis due to animal (cat) (dog) hair and dander: Secondary | ICD-10-CM | POA: Diagnosis not present

## 2023-11-06 DIAGNOSIS — J301 Allergic rhinitis due to pollen: Secondary | ICD-10-CM | POA: Diagnosis not present

## 2023-11-06 DIAGNOSIS — J3089 Other allergic rhinitis: Secondary | ICD-10-CM | POA: Diagnosis not present

## 2023-11-24 DIAGNOSIS — J3089 Other allergic rhinitis: Secondary | ICD-10-CM | POA: Diagnosis not present

## 2023-11-24 DIAGNOSIS — J3081 Allergic rhinitis due to animal (cat) (dog) hair and dander: Secondary | ICD-10-CM | POA: Diagnosis not present

## 2023-11-24 DIAGNOSIS — J301 Allergic rhinitis due to pollen: Secondary | ICD-10-CM | POA: Diagnosis not present

## 2023-12-01 DIAGNOSIS — J3089 Other allergic rhinitis: Secondary | ICD-10-CM | POA: Diagnosis not present

## 2023-12-01 DIAGNOSIS — J3081 Allergic rhinitis due to animal (cat) (dog) hair and dander: Secondary | ICD-10-CM | POA: Diagnosis not present

## 2023-12-01 DIAGNOSIS — J301 Allergic rhinitis due to pollen: Secondary | ICD-10-CM | POA: Diagnosis not present

## 2023-12-25 DIAGNOSIS — J301 Allergic rhinitis due to pollen: Secondary | ICD-10-CM | POA: Diagnosis not present

## 2023-12-25 DIAGNOSIS — J3089 Other allergic rhinitis: Secondary | ICD-10-CM | POA: Diagnosis not present

## 2023-12-25 DIAGNOSIS — J3081 Allergic rhinitis due to animal (cat) (dog) hair and dander: Secondary | ICD-10-CM | POA: Diagnosis not present

## 2024-01-03 DIAGNOSIS — J3089 Other allergic rhinitis: Secondary | ICD-10-CM | POA: Diagnosis not present

## 2024-01-03 DIAGNOSIS — J301 Allergic rhinitis due to pollen: Secondary | ICD-10-CM | POA: Diagnosis not present

## 2024-01-03 DIAGNOSIS — J3081 Allergic rhinitis due to animal (cat) (dog) hair and dander: Secondary | ICD-10-CM | POA: Diagnosis not present

## 2024-01-12 DIAGNOSIS — J301 Allergic rhinitis due to pollen: Secondary | ICD-10-CM | POA: Diagnosis not present

## 2024-01-12 DIAGNOSIS — J3089 Other allergic rhinitis: Secondary | ICD-10-CM | POA: Diagnosis not present

## 2024-01-12 DIAGNOSIS — J3081 Allergic rhinitis due to animal (cat) (dog) hair and dander: Secondary | ICD-10-CM | POA: Diagnosis not present

## 2024-02-09 DIAGNOSIS — J3089 Other allergic rhinitis: Secondary | ICD-10-CM | POA: Diagnosis not present

## 2024-02-09 DIAGNOSIS — J301 Allergic rhinitis due to pollen: Secondary | ICD-10-CM | POA: Diagnosis not present

## 2024-02-09 DIAGNOSIS — J3081 Allergic rhinitis due to animal (cat) (dog) hair and dander: Secondary | ICD-10-CM | POA: Diagnosis not present

## 2024-02-23 DIAGNOSIS — H1045 Other chronic allergic conjunctivitis: Secondary | ICD-10-CM | POA: Diagnosis not present

## 2024-02-23 DIAGNOSIS — J301 Allergic rhinitis due to pollen: Secondary | ICD-10-CM | POA: Diagnosis not present

## 2024-02-23 DIAGNOSIS — J3081 Allergic rhinitis due to animal (cat) (dog) hair and dander: Secondary | ICD-10-CM | POA: Diagnosis not present

## 2024-02-23 DIAGNOSIS — R0602 Shortness of breath: Secondary | ICD-10-CM | POA: Diagnosis not present

## 2024-02-28 DIAGNOSIS — J3081 Allergic rhinitis due to animal (cat) (dog) hair and dander: Secondary | ICD-10-CM | POA: Diagnosis not present

## 2024-02-28 DIAGNOSIS — J301 Allergic rhinitis due to pollen: Secondary | ICD-10-CM | POA: Diagnosis not present

## 2024-02-28 DIAGNOSIS — J3089 Other allergic rhinitis: Secondary | ICD-10-CM | POA: Diagnosis not present

## 2024-04-05 DIAGNOSIS — J3089 Other allergic rhinitis: Secondary | ICD-10-CM | POA: Diagnosis not present

## 2024-04-05 DIAGNOSIS — J3081 Allergic rhinitis due to animal (cat) (dog) hair and dander: Secondary | ICD-10-CM | POA: Diagnosis not present

## 2024-04-05 DIAGNOSIS — J301 Allergic rhinitis due to pollen: Secondary | ICD-10-CM | POA: Diagnosis not present

## 2024-05-06 DIAGNOSIS — D239 Other benign neoplasm of skin, unspecified: Secondary | ICD-10-CM | POA: Diagnosis not present

## 2024-05-06 DIAGNOSIS — D225 Melanocytic nevi of trunk: Secondary | ICD-10-CM | POA: Diagnosis not present

## 2024-05-06 DIAGNOSIS — L578 Other skin changes due to chronic exposure to nonionizing radiation: Secondary | ICD-10-CM | POA: Diagnosis not present

## 2024-05-06 DIAGNOSIS — J301 Allergic rhinitis due to pollen: Secondary | ICD-10-CM | POA: Diagnosis not present

## 2024-05-06 DIAGNOSIS — J3081 Allergic rhinitis due to animal (cat) (dog) hair and dander: Secondary | ICD-10-CM | POA: Diagnosis not present

## 2024-05-06 DIAGNOSIS — J3089 Other allergic rhinitis: Secondary | ICD-10-CM | POA: Diagnosis not present

## 2024-05-06 DIAGNOSIS — L814 Other melanin hyperpigmentation: Secondary | ICD-10-CM | POA: Diagnosis not present

## 2024-05-15 DIAGNOSIS — Z0000 Encounter for general adult medical examination without abnormal findings: Secondary | ICD-10-CM | POA: Diagnosis not present

## 2024-05-15 DIAGNOSIS — Z13220 Encounter for screening for lipoid disorders: Secondary | ICD-10-CM | POA: Diagnosis not present

## 2024-05-20 DIAGNOSIS — J301 Allergic rhinitis due to pollen: Secondary | ICD-10-CM | POA: Diagnosis not present

## 2024-05-20 DIAGNOSIS — J3081 Allergic rhinitis due to animal (cat) (dog) hair and dander: Secondary | ICD-10-CM | POA: Diagnosis not present

## 2024-05-20 DIAGNOSIS — J3089 Other allergic rhinitis: Secondary | ICD-10-CM | POA: Diagnosis not present

## 2024-05-20 DIAGNOSIS — Z0000 Encounter for general adult medical examination without abnormal findings: Secondary | ICD-10-CM | POA: Diagnosis not present

## 2024-05-27 DIAGNOSIS — J3089 Other allergic rhinitis: Secondary | ICD-10-CM | POA: Diagnosis not present

## 2024-05-27 DIAGNOSIS — J301 Allergic rhinitis due to pollen: Secondary | ICD-10-CM | POA: Diagnosis not present

## 2024-05-27 DIAGNOSIS — J3081 Allergic rhinitis due to animal (cat) (dog) hair and dander: Secondary | ICD-10-CM | POA: Diagnosis not present

## 2024-06-04 DIAGNOSIS — J3081 Allergic rhinitis due to animal (cat) (dog) hair and dander: Secondary | ICD-10-CM | POA: Diagnosis not present

## 2024-06-04 DIAGNOSIS — J3089 Other allergic rhinitis: Secondary | ICD-10-CM | POA: Diagnosis not present

## 2024-06-04 DIAGNOSIS — J301 Allergic rhinitis due to pollen: Secondary | ICD-10-CM | POA: Diagnosis not present

## 2024-06-18 DIAGNOSIS — J3081 Allergic rhinitis due to animal (cat) (dog) hair and dander: Secondary | ICD-10-CM | POA: Diagnosis not present

## 2024-06-18 DIAGNOSIS — J301 Allergic rhinitis due to pollen: Secondary | ICD-10-CM | POA: Diagnosis not present

## 2024-06-18 DIAGNOSIS — J3089 Other allergic rhinitis: Secondary | ICD-10-CM | POA: Diagnosis not present

## 2024-06-24 DIAGNOSIS — J3089 Other allergic rhinitis: Secondary | ICD-10-CM | POA: Diagnosis not present

## 2024-06-24 DIAGNOSIS — J3081 Allergic rhinitis due to animal (cat) (dog) hair and dander: Secondary | ICD-10-CM | POA: Diagnosis not present

## 2024-06-24 DIAGNOSIS — J301 Allergic rhinitis due to pollen: Secondary | ICD-10-CM | POA: Diagnosis not present

## 2024-06-26 DIAGNOSIS — K642 Third degree hemorrhoids: Secondary | ICD-10-CM | POA: Diagnosis not present

## 2024-07-02 DIAGNOSIS — J3081 Allergic rhinitis due to animal (cat) (dog) hair and dander: Secondary | ICD-10-CM | POA: Diagnosis not present

## 2024-07-02 DIAGNOSIS — J301 Allergic rhinitis due to pollen: Secondary | ICD-10-CM | POA: Diagnosis not present

## 2024-07-02 DIAGNOSIS — J3089 Other allergic rhinitis: Secondary | ICD-10-CM | POA: Diagnosis not present

## 2024-07-17 DIAGNOSIS — J3089 Other allergic rhinitis: Secondary | ICD-10-CM | POA: Diagnosis not present

## 2024-07-17 DIAGNOSIS — J301 Allergic rhinitis due to pollen: Secondary | ICD-10-CM | POA: Diagnosis not present

## 2024-07-17 DIAGNOSIS — J3081 Allergic rhinitis due to animal (cat) (dog) hair and dander: Secondary | ICD-10-CM | POA: Diagnosis not present

## 2024-07-24 DIAGNOSIS — K642 Third degree hemorrhoids: Secondary | ICD-10-CM | POA: Diagnosis not present

## 2024-07-25 DIAGNOSIS — J3081 Allergic rhinitis due to animal (cat) (dog) hair and dander: Secondary | ICD-10-CM | POA: Diagnosis not present

## 2024-07-25 DIAGNOSIS — J3089 Other allergic rhinitis: Secondary | ICD-10-CM | POA: Diagnosis not present

## 2024-07-25 DIAGNOSIS — J301 Allergic rhinitis due to pollen: Secondary | ICD-10-CM | POA: Diagnosis not present

## 2024-07-31 DIAGNOSIS — J3089 Other allergic rhinitis: Secondary | ICD-10-CM | POA: Diagnosis not present

## 2024-07-31 DIAGNOSIS — J301 Allergic rhinitis due to pollen: Secondary | ICD-10-CM | POA: Diagnosis not present

## 2024-07-31 DIAGNOSIS — J3081 Allergic rhinitis due to animal (cat) (dog) hair and dander: Secondary | ICD-10-CM | POA: Diagnosis not present

## 2024-08-05 DIAGNOSIS — J3089 Other allergic rhinitis: Secondary | ICD-10-CM | POA: Diagnosis not present

## 2024-08-05 DIAGNOSIS — J3081 Allergic rhinitis due to animal (cat) (dog) hair and dander: Secondary | ICD-10-CM | POA: Diagnosis not present

## 2024-08-05 DIAGNOSIS — J301 Allergic rhinitis due to pollen: Secondary | ICD-10-CM | POA: Diagnosis not present

## 2024-08-06 DIAGNOSIS — J3081 Allergic rhinitis due to animal (cat) (dog) hair and dander: Secondary | ICD-10-CM | POA: Diagnosis not present

## 2024-08-06 DIAGNOSIS — J301 Allergic rhinitis due to pollen: Secondary | ICD-10-CM | POA: Diagnosis not present

## 2024-08-06 DIAGNOSIS — J3089 Other allergic rhinitis: Secondary | ICD-10-CM | POA: Diagnosis not present

## 2024-08-07 DIAGNOSIS — J3081 Allergic rhinitis due to animal (cat) (dog) hair and dander: Secondary | ICD-10-CM | POA: Diagnosis not present

## 2024-08-07 DIAGNOSIS — J301 Allergic rhinitis due to pollen: Secondary | ICD-10-CM | POA: Diagnosis not present

## 2024-08-07 DIAGNOSIS — J3089 Other allergic rhinitis: Secondary | ICD-10-CM | POA: Diagnosis not present

## 2024-08-14 DIAGNOSIS — J3089 Other allergic rhinitis: Secondary | ICD-10-CM | POA: Diagnosis not present

## 2024-08-14 DIAGNOSIS — J301 Allergic rhinitis due to pollen: Secondary | ICD-10-CM | POA: Diagnosis not present

## 2024-08-14 DIAGNOSIS — J3081 Allergic rhinitis due to animal (cat) (dog) hair and dander: Secondary | ICD-10-CM | POA: Diagnosis not present

## 2024-08-21 DIAGNOSIS — J3089 Other allergic rhinitis: Secondary | ICD-10-CM | POA: Diagnosis not present

## 2024-08-21 DIAGNOSIS — J3081 Allergic rhinitis due to animal (cat) (dog) hair and dander: Secondary | ICD-10-CM | POA: Diagnosis not present

## 2024-08-21 DIAGNOSIS — J301 Allergic rhinitis due to pollen: Secondary | ICD-10-CM | POA: Diagnosis not present
